# Patient Record
Sex: Female | Born: 1940 | Race: White | Hispanic: No | Smoking: Never smoker
Health system: Southern US, Community
[De-identification: ages and names within clinical notes are randomized; demographics above are authoritative.]

## PROBLEM LIST (undated history)

## (undated) DIAGNOSIS — E039 Hypothyroidism, unspecified: Secondary | ICD-10-CM

## (undated) DIAGNOSIS — I10 Essential (primary) hypertension: Secondary | ICD-10-CM

## (undated) DIAGNOSIS — K219 Gastro-esophageal reflux disease without esophagitis: Secondary | ICD-10-CM

## (undated) DIAGNOSIS — R011 Cardiac murmur, unspecified: Secondary | ICD-10-CM

## (undated) DIAGNOSIS — I350 Nonrheumatic aortic (valve) stenosis: Secondary | ICD-10-CM

## (undated) DIAGNOSIS — G4730 Sleep apnea, unspecified: Secondary | ICD-10-CM

## (undated) HISTORY — DX: Hypothyroidism, unspecified: E03.9

## (undated) HISTORY — DX: Nonrheumatic aortic (valve) stenosis: I35.0

## (undated) HISTORY — PX: CHOLECYSTECTOMY: SHX55

## (undated) HISTORY — DX: Essential (primary) hypertension: I10

## (undated) HISTORY — PX: ABDOMINAL HYSTERECTOMY: SHX81

---

## 2012-06-27 DIAGNOSIS — R32 Unspecified urinary incontinence: Secondary | ICD-10-CM | POA: Insufficient documentation

## 2012-08-01 DIAGNOSIS — H35369 Drusen (degenerative) of macula, unspecified eye: Secondary | ICD-10-CM | POA: Insufficient documentation

## 2012-08-01 DIAGNOSIS — H02889 Meibomian gland dysfunction of unspecified eye, unspecified eyelid: Secondary | ICD-10-CM | POA: Insufficient documentation

## 2012-08-01 DIAGNOSIS — H02839 Dermatochalasis of unspecified eye, unspecified eyelid: Secondary | ICD-10-CM | POA: Insufficient documentation

## 2012-08-01 DIAGNOSIS — H04129 Dry eye syndrome of unspecified lacrimal gland: Secondary | ICD-10-CM | POA: Insufficient documentation

## 2012-08-01 DIAGNOSIS — H52223 Regular astigmatism, bilateral: Secondary | ICD-10-CM | POA: Insufficient documentation

## 2013-05-19 DIAGNOSIS — K2270 Barrett's esophagus without dysplasia: Secondary | ICD-10-CM | POA: Insufficient documentation

## 2015-04-02 DIAGNOSIS — E039 Hypothyroidism, unspecified: Secondary | ICD-10-CM

## 2015-04-02 DIAGNOSIS — I10 Essential (primary) hypertension: Secondary | ICD-10-CM

## 2015-04-02 DIAGNOSIS — Z0000 Encounter for general adult medical examination without abnormal findings: Secondary | ICD-10-CM | POA: Insufficient documentation

## 2015-09-24 DIAGNOSIS — K219 Gastro-esophageal reflux disease without esophagitis: Secondary | ICD-10-CM | POA: Insufficient documentation

## 2016-01-09 DIAGNOSIS — M25569 Pain in unspecified knee: Secondary | ICD-10-CM | POA: Insufficient documentation

## 2016-08-25 DIAGNOSIS — H04123 Dry eye syndrome of bilateral lacrimal glands: Secondary | ICD-10-CM | POA: Insufficient documentation

## 2016-08-25 DIAGNOSIS — H02009 Unspecified entropion of unspecified eye, unspecified eyelid: Secondary | ICD-10-CM | POA: Insufficient documentation

## 2016-08-25 DIAGNOSIS — H35363 Drusen (degenerative) of macula, bilateral: Secondary | ICD-10-CM | POA: Insufficient documentation

## 2016-08-25 DIAGNOSIS — H40013 Open angle with borderline findings, low risk, bilateral: Secondary | ICD-10-CM | POA: Insufficient documentation

## 2016-08-25 DIAGNOSIS — H11121 Conjunctival concretions, right eye: Secondary | ICD-10-CM | POA: Insufficient documentation

## 2016-08-25 DIAGNOSIS — H57819 Brow ptosis, unspecified: Secondary | ICD-10-CM | POA: Insufficient documentation

## 2016-08-25 DIAGNOSIS — H43813 Vitreous degeneration, bilateral: Secondary | ICD-10-CM | POA: Insufficient documentation

## 2016-08-25 DIAGNOSIS — H02831 Dermatochalasis of right upper eyelid: Secondary | ICD-10-CM | POA: Insufficient documentation

## 2016-08-25 DIAGNOSIS — H02059 Trichiasis without entropian unspecified eye, unspecified eyelid: Secondary | ICD-10-CM | POA: Insufficient documentation

## 2016-08-25 DIAGNOSIS — Z83511 Family history of glaucoma: Secondary | ICD-10-CM | POA: Insufficient documentation

## 2017-06-04 DIAGNOSIS — R011 Cardiac murmur, unspecified: Secondary | ICD-10-CM | POA: Insufficient documentation

## 2018-08-02 DIAGNOSIS — M51369 Other intervertebral disc degeneration, lumbar region without mention of lumbar back pain or lower extremity pain: Secondary | ICD-10-CM | POA: Insufficient documentation

## 2018-08-02 DIAGNOSIS — M461 Sacroiliitis, not elsewhere classified: Secondary | ICD-10-CM | POA: Insufficient documentation

## 2018-08-24 DIAGNOSIS — K921 Melena: Secondary | ICD-10-CM | POA: Insufficient documentation

## 2018-08-24 DIAGNOSIS — D649 Anemia, unspecified: Secondary | ICD-10-CM | POA: Insufficient documentation

## 2018-10-14 DIAGNOSIS — M2550 Pain in unspecified joint: Secondary | ICD-10-CM | POA: Insufficient documentation

## 2018-12-15 DIAGNOSIS — D509 Iron deficiency anemia, unspecified: Secondary | ICD-10-CM | POA: Insufficient documentation

## 2019-10-05 DIAGNOSIS — H269 Unspecified cataract: Secondary | ICD-10-CM | POA: Insufficient documentation

## 2021-05-02 ENCOUNTER — Other Ambulatory Visit: Payer: Self-pay

## 2021-05-16 ENCOUNTER — Encounter: Payer: Self-pay | Admitting: *Deleted

## 2021-05-27 ENCOUNTER — Other Ambulatory Visit: Payer: Self-pay

## 2021-05-27 ENCOUNTER — Encounter: Payer: Self-pay | Admitting: Cardiovascular Disease

## 2021-05-27 ENCOUNTER — Ambulatory Visit: Payer: Medicare HMO | Admitting: Cardiovascular Disease

## 2021-05-27 VITALS — BP 122/82 | HR 84 | Ht 62.0 in | Wt 206.0 lb

## 2021-05-27 DIAGNOSIS — I350 Nonrheumatic aortic (valve) stenosis: Secondary | ICD-10-CM

## 2021-05-27 DIAGNOSIS — Z01812 Encounter for preprocedural laboratory examination: Secondary | ICD-10-CM

## 2021-05-27 DIAGNOSIS — Z01818 Encounter for other preprocedural examination: Secondary | ICD-10-CM

## 2021-05-27 NOTE — Assessment & Plan Note (Signed)
This Castrogiovanni was referred to me by Dr. Hanley Hays for right left heart cath because of severe aortic stenosis.  She has been complaining of increasing dyspnea on exertion  which is lifestyle limiting.  Her 2D echo revealed normal LV systolic function with severe aortic stenosis.  Aortic valve area was 0.6 cm with a peak gradient of 53 mmHg.  She wishes to proceed with right and left heart cath as an outpatient (radial/brachial) with possible TAVR referral afterwards.

## 2021-05-27 NOTE — Patient Instructions (Addendum)
Medication Instructions:  No Changes In Medications at this time.  *If you need a refill on your cardiac medications before your next appointment, please call your pharmacy*  Lab Work: BMET/CBC- PLEASE RETURN FOR THIS ON Monday July 11th If you have labs (blood work) drawn today and your tests are completely normal, you will receive your results only by: MyChart Message (if you have MyChart) OR A paper copy in the mail If you have any lab test that is abnormal or we need to change your treatment, we will call you to review the results.  Testing/Procedures: Your physician has requested that you have a cardiac catheterization. Cardiac catheterization is used to diagnose and/or treat various heart conditions. Doctors may recommend this procedure for a number of different reasons. The most common reason is to evaluate chest pain. Chest pain can be a symptom of coronary artery disease (CAD), and cardiac catheterization can show whether plaque is narrowing or blocking your heart's arteries. This procedure is also used to evaluate the valves, as well as measure the blood flow and oxygen levels in different parts of your heart. For further information please visit [email or web address] Please follow instruction sheet, as given.   Follow-Up: At [facility], you and your health needs are our priority.  As part of our continuing mission to provide you with exceptional heart care, we have created designated Provider Care Teams.  These Care Teams include your primary Cardiologist (physician) and Advanced Practice Providers (APPs -  Physician Assistants and Nurse Practitioners) who all work together to provide you with the care you need, when you need it.  We recommend signing up for the patient portal called "MyChart".  Sign up information is provided on this After Visit Summary.  MyChart is used to connect with patients for Virtual Visits (Telemedicine).  Patients are able to view lab/test results, encounter  notes, upcoming appointments, etc.  Non-urgent messages can be sent to your provider as well.   To learn more about what you can do with MyChart, go to [email or web address].    Your next appointment:   2 week(s) POST CATH   The format for your next appointment:   In Person  Provider:   Nanetta Batty, MD  Other Instructions    [facility] CARDIOVASCULAR DIVISION [facility] [address] [place] [address] Dept: [phone] Loc: [phone]  Nicole Gates  05/27/2021  You are scheduled for a Cardiac Catheterization on Monday, July 18 with Dr. Nanetta Batty.  1. Please arrive at the [facility] (Main Entrance A) at [facility]: [address] [place], [place] [address] at 9:30 AM (This time is two hours before your procedure to ensure your preparation). Free valet parking service is available.   Special note: Every effort is made to have your procedure done on time. Please understand that emergencies sometimes delay scheduled procedures.  2. Diet: Do not eat solid foods after midnight.  The patient may have clear liquids until 5am upon the day of the procedure.  3. Labs: You will need to have blood drawn on Monday, July 11 at [facility] [address], [place]  Open: 8am - 5pm (Lunch 12:30 - 1:30)   Phone: [phone]. You do not need to be fasting.  4. Medication instructions in preparation for your procedure:  Stop taking, Lisinopril (Zestril or Prinivil) Sunday, July 17,   Contrast Allergy: No  On the morning of your procedure, take your Aspirin and any morning medicines  NOT listed above.  You may use sips of water.  5. Plan for one night stay--bring personal belongings. 6. Bring a current list of your medications and current insurance cards. 7. You MUST have a responsible person to drive you home. 8. Someone MUST be with you the first 24 hours after you arrive home or your discharge will  be delayed. 9. Please wear clothes that are easy to get on and off and wear slip-on shoes.  Thank you for allowing [place] to care for you!   -- [name]

## 2021-05-27 NOTE — H&P (View-Only) (Signed)
05/27/2021 Nicole Gates   1941/01/28  [id number]  Primary Physician Sherrill Raring, MD Primary Cardiologist: Runell Gess MD [name], [place]  HPI:  Nicole Gates is a 80 y.o. moderately overweight widowed Caucasian female mother of 44, grandmother of 9 grandchildren who is accompanied by one of her daughters Herbert Seta today.  She was referred by Dr. Hanley Hays, her cardiologist, for right left heart cath because of symptomatic severe aortic stenosis.  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  She has no cardiac risk factors other than treated hypertension.  She never smoked.  Her brother did die of a myocardial infarction.  She is never had a heart attack or stroke.  She denies chest pain but has had progressive dyspnea.  She also has obstructive sleep apnea on CPAP.  She had a 2D echo performed by Dr. Hanley Hays revealing normal LV systolic function with severe aortic stenosis.  Valve area was 0.6 cm with a peak gradient of 57 mmHg.  A Myoview stress test was normal as well.  She has noticed increasing dyspnea on exertion when doing normal activity.   Current Meds  Medication Sig   Cholecalciferol (VITAMIN D3) 125 MCG (5000 UT) CAPS Take by mouth.   gabapentin (NEURONTIN) 300 MG capsule Take 300 mg by mouth 2 (two) times daily.   levothyroxine (SYNTHROID) 100 MCG tablet Take 100 mcg by mouth daily before breakfast.   lisinopril (ZESTRIL) 10 MG tablet Take 10 mg by mouth daily.   Magnesium 400 MG TABS Take by mouth.   mirabegron ER (MYRBETRIQ) 50 MG TB24 tablet Take 50 mg by mouth daily.   Potassium 99 MG TABS Take by mouth.   [DISCONTINUED] ferrous sulfate 325 (65 FE) MG EC tablet Take 325 mg by mouth 3 (three) times daily with meals.   [DISCONTINUED] pantoprazole (PROTONIX) 40 MG tablet Take 40 mg by mouth daily.     No Known Allergies  Social History   Socioeconomic History   Marital status: Widowed    Spouse name: Not  on file   Number of children: Not on file   Years of education: Not on file   Highest education level: Not on file  Occupational History   Not on file  Tobacco Use   Smoking status: Never   Smokeless tobacco: Never  Vaping Use   Vaping Use: Never used  Substance and Sexual Activity   Alcohol use: Never   Drug use: Not on file   Sexual activity: Not on file  Other Topics Concern   Not on file  Social History Narrative   Not on file   Social Determinants of Health   Financial Resource Strain: Not on file  Food Insecurity: Not on file  Transportation Needs: Not on file  Physical Activity: Not on file  Stress: Not on file  Social Connections: Not on file  Intimate Partner Violence: Not on file     Review of Systems: General: negative for chills, fever, night sweats or weight changes.  Cardiovascular: negative for chest pain, dyspnea on exertion, edema, orthopnea, palpitations, paroxysmal nocturnal dyspnea or shortness of breath Dermatological: negative for rash Respiratory: negative for cough or wheezing Urologic: negative for hematuria Abdominal: negative for nausea, vomiting, diarrhea, bright red blood per rectum, melena, or hematemesis Neurologic: negative for visual changes, syncope, or dizziness All other systems reviewed and are otherwise negative except as noted above.    Blood pressure  122/82, pulse 84, height 5' 2" (1.575 m), weight 206 lb (93.4 kg).  General appearance: alert and no distress Neck: no adenopathy, no JVD, supple, symmetrical, trachea midline, thyroid not enlarged, symmetric, no tenderness/mass/nodules, and bilateral carotid bruits versus transmitted murmur Lungs: clear to auscultation bilaterally Heart: 2/6 systolic ejection murmur at the base consistent with aortic stenosis Extremities: extremities normal, atraumatic, no cyanosis or edema Pulses: 2+ and symmetric Skin: Skin color, texture, turgor normal. No rashes or lesions Neurologic: Grossly  normal  EKG sinus rhythm at 84 with left bundle branch block.  I personally reviewed this EKG.  ASSESSMENT AND PLAN:   Severe aortic stenosis This Warriner was referred to me by Dr. Rosario for right left heart cath because of severe aortic stenosis.  She has been complaining of increasing dyspnea on exertion  which is lifestyle limiting.  Her 2D echo revealed normal LV systolic function with severe aortic stenosis.  Aortic valve area was 0.6 cm with a peak gradient of 53 mmHg.  She wishes to proceed with right and left heart cath as an outpatient (radial/brachial) with possible TAVR referral afterwards.     [name] J. Blessen Kimbrough MD FACP,FACC,FAHA, FSCAI 05/27/2021 11:40 AM 

## 2021-05-27 NOTE — Progress Notes (Signed)
05/27/2021 [name]   1941/01/28  [id number]  Primary Physician Sherrill Raring, MD Primary Cardiologist: Runell Gess MD [name], [place]  HPI:  Nayeliz Hipp is a 80 y.o. moderately overweight widowed Caucasian female mother of 44, grandmother of 9 grandchildren who is accompanied by one of her daughters Herbert Seta today.  She was referred by Dr. Hanley Hays, her cardiologist, for right left heart cath because of symptomatic severe aortic stenosis.  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  She has no cardiac risk factors other than treated hypertension.  She never smoked.  Her brother did die of a myocardial infarction.  She is never had a heart attack or stroke.  She denies chest pain but has had progressive dyspnea.  She also has obstructive sleep apnea on CPAP.  She had a 2D echo performed by Dr. Hanley Hays revealing normal LV systolic function with severe aortic stenosis.  Valve area was 0.6 cm with a peak gradient of 57 mmHg.  A Myoview stress test was normal as well.  She has noticed increasing dyspnea on exertion when doing normal activity.   Current Meds  Medication Sig   Cholecalciferol (VITAMIN D3) 125 MCG (5000 UT) CAPS Take by mouth.   gabapentin (NEURONTIN) 300 MG capsule Take 300 mg by mouth 2 (two) times daily.   levothyroxine (SYNTHROID) 100 MCG tablet Take 100 mcg by mouth daily before breakfast.   lisinopril (ZESTRIL) 10 MG tablet Take 10 mg by mouth daily.   Magnesium 400 MG TABS Take by mouth.   mirabegron ER (MYRBETRIQ) 50 MG TB24 tablet Take 50 mg by mouth daily.   Potassium 99 MG TABS Take by mouth.   [DISCONTINUED] ferrous sulfate 325 (65 FE) MG EC tablet Take 325 mg by mouth 3 (three) times daily with meals.   [DISCONTINUED] pantoprazole (PROTONIX) 40 MG tablet Take 40 mg by mouth daily.     No Known Allergies  Social History   Socioeconomic History   Marital status: Widowed    Spouse name: Not  on file   Number of children: Not on file   Years of education: Not on file   Highest education level: Not on file  Occupational History   Not on file  Tobacco Use   Smoking status: Never   Smokeless tobacco: Never  Vaping Use   Vaping Use: Never used  Substance and Sexual Activity   Alcohol use: Never   Drug use: Not on file   Sexual activity: Not on file  Other Topics Concern   Not on file  Social History Narrative   Not on file   Social Determinants of Health   Financial Resource Strain: Not on file  Food Insecurity: Not on file  Transportation Needs: Not on file  Physical Activity: Not on file  Stress: Not on file  Social Connections: Not on file  Intimate Partner Violence: Not on file     Review of Systems: General: negative for chills, fever, night sweats or weight changes.  Cardiovascular: negative for chest pain, dyspnea on exertion, edema, orthopnea, palpitations, paroxysmal nocturnal dyspnea or shortness of breath Dermatological: negative for rash Respiratory: negative for cough or wheezing Urologic: negative for hematuria Abdominal: negative for nausea, vomiting, diarrhea, bright red blood per rectum, melena, or hematemesis Neurologic: negative for visual changes, syncope, or dizziness All other systems reviewed and are otherwise negative except as noted above.    Blood pressure  122/82, pulse 84, height 5\' 2"  (1.575 m), weight 206 lb (93.4 kg).  General appearance: alert and no distress Neck: no adenopathy, no JVD, supple, symmetrical, trachea midline, thyroid not enlarged, symmetric, no tenderness/mass/nodules, and bilateral carotid bruits versus transmitted murmur Lungs: clear to auscultation bilaterally Heart: 2/6 systolic ejection murmur at the base consistent with aortic stenosis Extremities: extremities normal, atraumatic, no cyanosis or edema Pulses: 2+ and symmetric Skin: Skin color, texture, turgor normal. No rashes or lesions Neurologic: Grossly  normal  EKG sinus rhythm at 84 with left bundle branch block.  I personally reviewed this EKG.  ASSESSMENT AND PLAN:   Severe aortic stenosis This Huynh was referred to me by Dr. for right left heart cath because of severe aortic stenosis.  She has been complaining of increasing dyspnea on exertion  which is lifestyle limiting.  Her 2D echo revealed normal LV systolic function with severe aortic stenosis.  Aortic valve area was 0.6 cm with a peak gradient of 53 mmHg.  She wishes to proceed with right and left heart cath as an outpatient (radial/brachial) with possible TAVR referral afterwards.     Hanley Hays MD FACP,FACC,FAHA, [facility] 05/27/2021 11:40 AM

## 2021-06-02 LAB — BASIC METABOLIC PANEL
BUN/Creatinine Ratio: 21 (ref 12–28)
BUN: 17 mg/dL (ref 8–27)
CO2: 25 mmol/L (ref 20–29)
Calcium: 10.2 mg/dL (ref 8.7–10.3)
Chloride: 100 mmol/L (ref 96–106)
Creatinine, Ser: 0.8 mg/dL (ref 0.57–1.00)
Glucose: 100 mg/dL — ABNORMAL HIGH (ref 65–99)
Potassium: 5.2 mmol/L (ref 3.5–5.2)
Sodium: 140 mmol/L (ref 134–144)
eGFR: 75 mL/min/{1.73_m2} (ref >59)

## 2021-06-02 LAB — CBC
Hematocrit: 33.8 % — ABNORMAL LOW (ref 34.0–46.6)
Hemoglobin: 10.7 g/dL — ABNORMAL LOW (ref 11.1–15.9)
MCH: 26.6 pg (ref 26.6–33.0)
MCHC: 31.7 g/dL (ref 31.5–35.7)
MCV: 84 fL (ref 79–97)
Platelets: 321 10*3/uL (ref 150–450)
RBC: 4.03 x10E6/uL (ref 3.77–5.28)
RDW: 13.1 % (ref 11.7–15.4)
WBC: 6.4 10*3/uL (ref 3.4–10.8)

## 2021-06-05 ENCOUNTER — Telehealth: Payer: Self-pay | Admitting: *Deleted

## 2021-06-05 NOTE — Telephone Encounter (Signed)
Pt contacted pre-catheterization scheduled at [facility] for: Monday June 09, 2021 11:30 AM Verified arrival time and place: [facility] Main Entrance A [facility]) at: 9:30 AM   No solid food after midnight prior to cath, clear liquids until 5 AM day of procedure.   AM meds can be  taken pre-cath with sips of water including: aspirin 81 mg   Confirmed patient has responsible adult to drive home post procedure and be with patient first 24 hours after arriving home: yes  You are allowed ONE visitor in the waiting room during the time you are at the hospital for your procedure. Both you and your visitor must wear a mask once you enter the hospital.   Patient reports does not currently have any symptoms concerning for COVID-19 and no household members with COVID-19 like illness.      Reviewed procedure/mask/visitor instructions with patient.

## 2021-06-09 ENCOUNTER — Ambulatory Visit (HOSPITAL_COMMUNITY)
Admission: RE | Admit: 2021-06-09 | Discharge: 2021-06-09 | Disposition: A | Discharge status: Home / Self Care | Payer: Medicare HMO | Attending: Cardiovascular Disease | Admitting: Cardiovascular Disease

## 2021-06-09 ENCOUNTER — Other Ambulatory Visit: Payer: Self-pay

## 2021-06-09 ENCOUNTER — Encounter (HOSPITAL_COMMUNITY)
Admission: RE | Disposition: A | Discharge status: Home / Self Care | Payer: Self-pay | Source: Home / Self Care | Attending: Cardiovascular Disease

## 2021-06-09 DIAGNOSIS — Z006 Encounter for examination for normal comparison and control in clinical research program: Secondary | ICD-10-CM

## 2021-06-09 DIAGNOSIS — Z8249 Family history of ischemic heart disease and other diseases of the circulatory system: Secondary | ICD-10-CM | POA: Insufficient documentation

## 2021-06-09 DIAGNOSIS — I350 Nonrheumatic aortic (valve) stenosis: Secondary | ICD-10-CM | POA: Insufficient documentation

## 2021-06-09 DIAGNOSIS — Z79899 Other long term (current) drug therapy: Secondary | ICD-10-CM | POA: Diagnosis not present

## 2021-06-09 DIAGNOSIS — Z6836 Body mass index (BMI) 36.0-36.9, adult: Secondary | ICD-10-CM | POA: Diagnosis not present

## 2021-06-09 DIAGNOSIS — Z79890 Hormone replacement therapy (postmenopausal): Secondary | ICD-10-CM | POA: Insufficient documentation

## 2021-06-09 DIAGNOSIS — I10 Essential (primary) hypertension: Secondary | ICD-10-CM | POA: Insufficient documentation

## 2021-06-09 DIAGNOSIS — G4733 Obstructive sleep apnea (adult) (pediatric): Secondary | ICD-10-CM | POA: Diagnosis not present

## 2021-06-09 DIAGNOSIS — E663 Overweight: Secondary | ICD-10-CM | POA: Diagnosis not present

## 2021-06-09 HISTORY — PX: RIGHT/LEFT HEART CATH AND CORONARY ANGIOGRAPHY: CATH118266

## 2021-06-09 LAB — POCT I-STAT EG7
Acid-Base Excess: 3 mmol/L — ABNORMAL HIGH (ref 0.0–2.0)
Acid-Base Excess: 3 mmol/L — ABNORMAL HIGH (ref 0.0–2.0)
Bicarbonate: 29.2 mmol/L — ABNORMAL HIGH (ref 20.0–28.0)
Bicarbonate: 29.2 mmol/L — ABNORMAL HIGH (ref 20.0–28.0)
Calcium, Ion: 1.33 mmol/L (ref 1.15–1.40)
Calcium, Ion: 1.34 mmol/L (ref 1.15–1.40)
HCT: 31 % — ABNORMAL LOW (ref 36.0–46.0)
HCT: 31 % — ABNORMAL LOW (ref 36.0–46.0)
Hemoglobin: 10.5 g/dL — ABNORMAL LOW (ref 12.0–15.0)
Hemoglobin: 10.5 g/dL — ABNORMAL LOW (ref 12.0–15.0)
O2 Saturation: 78 %
O2 Saturation: 79 %
Potassium: 4.1 mmol/L (ref 3.5–5.1)
Potassium: 4.1 mmol/L (ref 3.5–5.1)
Sodium: 142 mmol/L (ref 135–145)
Sodium: 142 mmol/L (ref 135–145)
TCO2: 31 mmol/L (ref 22–32)
TCO2: 31 mmol/L (ref 22–32)
pCO2, Ven: 49 mmHg (ref 44.0–60.0)
pCO2, Ven: 49.3 mmHg (ref 44.0–60.0)
pH, Ven: 7.38 (ref 7.250–7.430)
pH, Ven: 7.383 (ref 7.250–7.430)
pO2, Ven: 44 mmHg (ref 32.0–45.0)
pO2, Ven: 45 mmHg (ref 32.0–45.0)

## 2021-06-09 LAB — POCT I-STAT 7, (LYTES, BLD GAS, ICA,H+H)
Acid-Base Excess: 4 mmol/L — ABNORMAL HIGH (ref 0.0–2.0)
Bicarbonate: 29 mmol/L — ABNORMAL HIGH (ref 20.0–28.0)
Calcium, Ion: 1.27 mmol/L (ref 1.15–1.40)
HCT: 30 % — ABNORMAL LOW (ref 36.0–46.0)
Hemoglobin: 10.2 g/dL — ABNORMAL LOW (ref 12.0–15.0)
O2 Saturation: 99 %
Potassium: 4.1 mmol/L (ref 3.5–5.1)
Sodium: 142 mmol/L (ref 135–145)
TCO2: 30 mmol/L (ref 22–32)
pCO2 arterial: 44.3 mmHg (ref 32.0–48.0)
pH, Arterial: 7.424 (ref 7.350–7.450)
pO2, Arterial: 160 mmHg — ABNORMAL HIGH (ref 83.0–108.0)

## 2021-06-09 SURGERY — RIGHT/LEFT HEART CATH AND CORONARY ANGIOGRAPHY
Anesthesia: LOCAL

## 2021-06-09 MED ORDER — NITROGLYCERIN 1 MG/10 ML FOR IR/CATH LAB
INTRA_ARTERIAL | Status: AC
  Filled 2021-06-09: qty 10

## 2021-06-09 MED ORDER — SODIUM CHLORIDE 0.9 % WEIGHT BASED INFUSION: 1.0000 mL/kg/h | INTRAVENOUS | Status: DC

## 2021-06-09 MED ORDER — SODIUM CHLORIDE 0.9 % IV SOLN: INTRAVENOUS | Status: DC

## 2021-06-09 MED ORDER — ASPIRIN 81 MG PO CHEW
81.0000 mg | CHEWABLE_TABLET | Freq: Every day | ORAL | Status: DC
  Administered 2021-06-09: 81 mg via ORAL
  Filled 2021-06-09: qty 1

## 2021-06-09 MED ORDER — IOHEXOL 350 MG/ML SOLN
INTRAVENOUS | Status: DC | PRN
  Administered 2021-06-09: 35 mL

## 2021-06-09 MED ORDER — SODIUM CHLORIDE 0.9% FLUSH: 3.0000 mL | INTRAVENOUS | Status: DC | PRN

## 2021-06-09 MED ORDER — HYDRALAZINE HCL 20 MG/ML IJ SOLN: 10.0000 mg | INTRAMUSCULAR | Status: DC | PRN

## 2021-06-09 MED ORDER — ASPIRIN 81 MG PO CHEW: 81.0000 mg | CHEWABLE_TABLET | ORAL | Status: DC

## 2021-06-09 MED ORDER — ACETAMINOPHEN 325 MG PO TABS: 650.0000 mg | ORAL_TABLET | ORAL | Status: DC | PRN

## 2021-06-09 MED ORDER — HEPARIN (PORCINE) IN NACL 1000-0.9 UT/500ML-% IV SOLN
INTRAVENOUS | Status: DC | PRN
  Administered 2021-06-09 (×2): 500 mL

## 2021-06-09 MED ORDER — VERAPAMIL HCL 2.5 MG/ML IV SOLN
INTRA_ARTERIAL | Status: DC | PRN
  Administered 2021-06-09: 6 mL via INTRA_ARTERIAL

## 2021-06-09 MED ORDER — HEPARIN SODIUM (PORCINE) 1000 UNIT/ML IJ SOLN
INTRAMUSCULAR | Status: DC | PRN
  Administered 2021-06-09: 4500 [IU] via INTRAVENOUS

## 2021-06-09 MED ORDER — ONDANSETRON HCL 4 MG/2ML IJ SOLN: 4.0000 mg | Freq: Four times a day (QID) | INTRAMUSCULAR | Status: DC | PRN

## 2021-06-09 MED ORDER — SODIUM CHLORIDE 0.9 % IV SOLN: 250.0000 mL | INTRAVENOUS | Status: DC | PRN

## 2021-06-09 MED ORDER — SODIUM CHLORIDE 0.9 % WEIGHT BASED INFUSION
3.0000 mL/kg/h | INTRAVENOUS | Status: AC
  Administered 2021-06-09: 3 mL/kg/h via INTRAVENOUS

## 2021-06-09 MED ORDER — HEPARIN SODIUM (PORCINE) 1000 UNIT/ML IJ SOLN
INTRAMUSCULAR | Status: AC
  Filled 2021-06-09: qty 1

## 2021-06-09 MED ORDER — SODIUM CHLORIDE 0.9% FLUSH: 3.0000 mL | Freq: Two times a day (BID) | INTRAVENOUS | Status: DC

## 2021-06-09 MED ORDER — LIDOCAINE HCL (PF) 1 % IJ SOLN
INTRAMUSCULAR | Status: AC
  Filled 2021-06-09: qty 30

## 2021-06-09 MED ORDER — MORPHINE SULFATE (PF) 2 MG/ML IV SOLN: 2.0000 mg | INTRAVENOUS | Status: DC | PRN

## 2021-06-09 MED ORDER — HEPARIN (PORCINE) IN NACL 1000-0.9 UT/500ML-% IV SOLN
INTRAVENOUS | Status: AC
  Filled 2021-06-09: qty 1000

## 2021-06-09 MED ORDER — VERAPAMIL HCL 2.5 MG/ML IV SOLN
INTRAVENOUS | Status: AC
  Filled 2021-06-09: qty 2

## 2021-06-09 MED ORDER — LIDOCAINE HCL (PF) 1 % IJ SOLN
INTRAMUSCULAR | Status: DC | PRN
  Administered 2021-06-09: 10 mL
  Administered 2021-06-09: 2 mL via SUBCUTANEOUS

## 2021-06-09 MED ORDER — LABETALOL HCL 5 MG/ML IV SOLN: 10.0000 mg | INTRAVENOUS | Status: DC | PRN

## 2021-06-09 SURGICAL SUPPLY — 16 items
CATH INFINITI JR4 5F (CATHETERS) ×2 IMPLANT
CATH OPTITORQUE TIG 4.0 5F (CATHETERS) ×2 IMPLANT
CATH SWAN GANZ 7F STRAIGHT (CATHETERS) ×2 IMPLANT
CLOSURE MYNX CONTROL 6F/7F (Vascular Products) ×2 IMPLANT
DEVICE RAD COMP TR BAND LRG (VASCULAR PRODUCTS) ×2 IMPLANT
GLIDESHEATH SLEND A-KIT 6F 22G (SHEATH) ×2 IMPLANT
GUIDEWIRE INQWIRE 1.5J.035X260 (WIRE) ×1 IMPLANT
INQWIRE 1.5J .035X260CM (WIRE) ×2
KIT HEART LEFT (KITS) ×2 IMPLANT
PACK CARDIAC CATHETERIZATION (CUSTOM PROCEDURE TRAY) ×2 IMPLANT
SHEATH GLIDE SLENDER 4/5FR (SHEATH) IMPLANT
SHEATH PINNACLE 7F 10CM (SHEATH) ×2 IMPLANT
TRANSDUCER W/STOPCOCK (MISCELLANEOUS) ×2 IMPLANT
TUBING CIL FLEX 10 FLL-RA (TUBING) ×2 IMPLANT
WIRE EMERALD ST .035X260CM (WIRE) ×2 IMPLANT
WIRE HI TORQ VERSACORE-J 145CM (WIRE) ×2 IMPLANT

## 2021-06-09 NOTE — Research (Signed)
IDENTIFY Informed Consent                  Subject Name: Nicole Gates    Subject met inclusion and exclusion criteria.  The informed consent form, study requirements and expectations were reviewed with the subject and questions and concerns were addressed prior to the signing of the consent form.  The subject verbalized understanding of the trial requirements.  The subject agreed to participate in the IDENTIFY trial and signed the informed consent at 10:13AM on 06/09/21.  The informed consent was obtained prior to performance of any protocol-specific procedures for the subject.  A copy of the signed informed consent was given to the subject and a copy was placed in the subject's medical record.    Ledon Snare , Research Assistant

## 2021-06-09 NOTE — Interval H&P Note (Signed)
Cath Lab Visit (complete for each Cath Lab visit)  Clinical Evaluation Leading to the Procedure:   ACS: No.  Non-ACS:    Anginal Classification: No Symptoms  Anti-ischemic medical therapy: No Therapy  Non-Invasive Test Results: Low-risk stress test findings: cardiac mortality <1%/year  Prior CABG: No previous CABG      History and Physical Interval Note:  06/09/2021 12:52 PM  Nicole Gates  has presented today for surgery, with the diagnosis of aortic stenosis.  The various methods of treatment have been discussed with the patient and family. After consideration of risks, benefits and other options for treatment, the patient has consented to  Procedure(s): RIGHT/LEFT HEART CATH AND CORONARY ANGIOGRAPHY (N/A) as a surgical intervention.  The patient's history has been reviewed, patient examined, no change in status, stable for surgery.  I have reviewed the patient's chart and labs.  Questions were answered to the patient's satisfaction.     Nanetta Batty

## 2021-06-10 ENCOUNTER — Encounter (HOSPITAL_COMMUNITY): Payer: Self-pay | Admitting: Cardiovascular Disease

## 2021-07-01 ENCOUNTER — Other Ambulatory Visit: Payer: Self-pay

## 2021-07-01 ENCOUNTER — Ambulatory Visit: Payer: Medicare HMO | Admitting: Cardiovascular Disease

## 2021-07-01 ENCOUNTER — Encounter: Payer: Self-pay | Admitting: Cardiovascular Disease

## 2021-07-01 VITALS — BP 134/74 | HR 84 | Ht 62.0 in | Wt 211.0 lb

## 2021-07-01 DIAGNOSIS — I350 Nonrheumatic aortic (valve) stenosis: Secondary | ICD-10-CM | POA: Diagnosis not present

## 2021-07-01 NOTE — Patient Instructions (Signed)
  Follow-Up: At [facility], you and your health needs are our priority.  As part of our continuing mission to provide you with exceptional heart care, we have created designated Provider Care Teams.  These Care Teams include your primary Cardiologist (physician) and Advanced Practice Providers (APPs -  Physician Assistants and Nurse Practitioners) who all work together to provide you with the care you need, when you need it.  We recommend signing up for the patient portal called "MyChart".  Sign up information is provided on this After Visit Summary.  MyChart is used to connect with patients for Virtual Visits (Telemedicine).  Patients are able to view lab/test results, encounter notes, upcoming appointments, etc.  Non-urgent messages can be sent to your provider as well.   To learn more about what you can do with MyChart, go to [email or web address].    Your next appointment:   3 month(s)  The format for your next appointment:   In Person  Provider:   Jonathan Berry, MD    

## 2021-07-01 NOTE — Progress Notes (Signed)
Ms. Hibbitts returns for follow-up after her right and left heart cath which I performed 06/09/2021 in the evaluation of aortic stenosis.  She was referred by Dr. Hanley Hays.  She had clean coronary arteries and a indexed valve area of 0.66 cm/m.  She is fairly functionally limited.  I am referring her to the structural valve clinic for evaluation for suitability of TAVR.  Runell Gess, M.D., FACP, [facility], [facility] [address] [place], [place]  [address]  [phone] 07/01/2021 11:13 AM

## 2021-07-02 ENCOUNTER — Other Ambulatory Visit: Payer: Self-pay

## 2021-07-02 DIAGNOSIS — I350 Nonrheumatic aortic (valve) stenosis: Secondary | ICD-10-CM

## 2021-07-25 ENCOUNTER — Ambulatory Visit: Payer: Medicare HMO | Admitting: Cardiovascular Disease

## 2021-07-25 ENCOUNTER — Other Ambulatory Visit: Payer: Self-pay

## 2021-07-25 ENCOUNTER — Encounter: Payer: Self-pay | Admitting: Cardiovascular Disease

## 2021-07-25 ENCOUNTER — Ambulatory Visit (HOSPITAL_COMMUNITY): Payer: Medicare HMO | Attending: Cardiology

## 2021-07-25 VITALS — BP 138/76 | HR 82 | Ht 62.0 in | Wt 209.6 lb

## 2021-07-25 DIAGNOSIS — I350 Nonrheumatic aortic (valve) stenosis: Secondary | ICD-10-CM

## 2021-07-25 DIAGNOSIS — K089 Disorder of teeth and supporting structures, unspecified: Secondary | ICD-10-CM

## 2021-07-25 LAB — ECHOCARDIOGRAM COMPLETE
AR max vel: 0.92 cm2
AV Area VTI: 0.87 cm2
AV Area mean vel: 0.89 cm2
AV Mean grad: 27.7 mmHg
AV Peak grad: 46.5 mmHg
Ao pk vel: 3.41 m/s
Area-P 1/2: 4.57 cm2
S' Lateral: 3.2 cm

## 2021-07-25 NOTE — Patient Instructions (Signed)
Medication Instructions:  No changes *If you need a refill on your cardiac medications before your next appointment, please call your pharmacy*   Lab Work: BMET If you have labs (blood work) drawn today and your tests are completely normal, you will receive your results only by: MyChart Message (if you have MyChart) OR A paper copy in the mail If you have any lab test that is abnormal or we need to change your treatment, we will call you to review the results.   Testing/Procedures: none  Follow-Up: Per Structural Heart Team  Other Instructions

## 2021-07-25 NOTE — Progress Notes (Addendum)
[facility] Consult Note  Chief Complaint  Patient presents with   New Patient (Initial Visit)    Severe aortic stenosis    History of Present Illness:80 yo female with history of HTN, hypothyroidism, peripheral neuropathy and severe aortic stenosis who is here today as a new consult, referred by Dr. Allyson Sabal, for further discussion regarding her aortic stenosis and possible TAVR. She was seen recently by Dr. Allyson Sabal to arrange a right and left heart cath to better evaluate her aortic stenosis. She has been followed prior to that by Dr. Hanley Hays. She has had recent progressive dyspnea on exertion. Cardiac cath 06/09/21 with no evidence of CAD. AV mean gradient 27.32mmHg. Echo from May 2022 in [facility] with LVEF over 60%, mean AV gradient 30 mmHg, AVA 0.6 cm2. Echo today in our office with LVEF=45-50%, Mild MR. The aortic valve appears stenotic. The leaflets are thickened and calcified. Mean gradient 27 mmHg. AVA 0.87 cm2. SVI 34. Dimensionless index 0.28. I think this represents early severe aortic stenosis.   She tells me today that she has progressive dyspnea on exertion, chest pressure and dizziness. She has some LE edema. She lives in [place], [place] in a senior apartment alone. She is a retired Futures trader. She has partial dentures on top and bottom and reports that some of her teeth are broken.    Primary Care Physician: Sherrill Raring, MD Primary Cardiologist: Allyson Sabal Referring Cardiologist: Allyson Sabal  Past Medical History:  Diagnosis Date   Hypertension    Hypothyroid    Severe aortic stenosis     Past Surgical History:  Procedure Laterality Date   ABDOMINAL HYSTERECTOMY     CHOLECYSTECTOMY     RIGHT/LEFT HEART CATH AND CORONARY ANGIOGRAPHY N/A 06/09/2021   Procedure: RIGHT/LEFT HEART CATH AND CORONARY ANGIOGRAPHY;  Surgeon: Runell Gess, MD;  Location: [facility];  Service: Cardiovascular;  Laterality: N/A;    Current Outpatient Medications   Medication Sig Dispense Refill   Ascorbic Acid (VITAMIN C PO) Take 1 capsule by mouth daily.     calcium carbonate (OS-CAL) 1250 (500 Ca) MG chewable tablet Chew 1 tablet by mouth daily.     Cholecalciferol (VITAMIN D3) 125 MCG (5000 UT) CAPS Take 5,000 Units by mouth daily.     estradiol (ESTRACE) 0.1 MG/GM vaginal cream Place 1 Applicatorful vaginally at bedtime.     fluticasone (FLONASE) 50 MCG/ACT nasal spray Place 1 spray into both nostrils daily.     gabapentin (NEURONTIN) 300 MG capsule Take 600 mg by mouth at bedtime.     levothyroxine (SYNTHROID) 100 MCG tablet Take 100 mcg by mouth daily before breakfast.     lisinopril (ZESTRIL) 10 MG tablet Take 10 mg by mouth daily.     loratadine (CLARITIN) 10 MG tablet Take 10 mg by mouth daily.     Magnesium 400 MG TABS Take 400 mg by mouth daily.     mirabegron ER (MYRBETRIQ) 50 MG TB24 tablet Take 50 mg by mouth daily.     Multiple Vitamins-Minerals (ZINC PO) Take 1 capsule by mouth daily.     niacin 500 MG tablet Take 500 mg by mouth daily.     nystatin cream (MYCOSTATIN) Apply 1 application topically 2 (two) times daily.     pantoprazole (PROTONIX) 40 MG tablet Take 40 mg by mouth daily.     polyvinyl alcohol (LIQUIFILM TEARS) 1.4 % ophthalmic solution Place 1 drop into both eyes daily as needed for dry eyes.  POTASSIUM PO Take 595 mg by mouth daily.     No current facility-administered medications for this visit.    No Known Allergies  Social History   Socioeconomic History   Marital status: Widowed    Spouse name: Not on file   Number of children: 6   Years of education: Not on file   Highest education level: Not on file  Occupational History   Occupation: Futures traderHomemaker, drove a school bus, Engineer, petroleumcafeteria worker  Tobacco Use   Smoking status: Never   Smokeless tobacco: Never  Vaping Use   Vaping Use: Never used  Substance and Sexual Activity   Alcohol use: Never   Drug use: Never   Sexual activity: Not on file  Other  Topics Concern   Not on file  Social History Narrative   Not on file   Social Determinants of Health   Financial Resource Strain: Not on file  Food Insecurity: Not on file  Transportation Needs: Not on file  Physical Activity: Not on file  Stress: Not on file  Social Connections: Not on file  Intimate Partner Violence: Not on file    Family History  Problem Relation Age of Onset   Cancer Father    Diabetes Brother     Review of Systems:  As stated in the HPI and otherwise negative.   BP 138/76   Pulse 82   Ht 5\' 2"  (1.575 m)   Wt 209 lb 9.6 oz (95.1 kg)   SpO2 97%   BMI 38.34 kg/m   Physical Examination: General: Well developed, well nourished, NAD  HEENT: OP clear, mucus membranes moist  SKIN: warm, dry. No rashes. Neuro: No focal deficits  Musculoskeletal: Muscle strength 5/5 all ext  Psychiatric: Mood and affect normal  Neck: No JVD, no carotid bruits, no thyromegaly, no lymphadenopathy.  Lungs:Clear bilaterally, no wheezes, rhonci, crackles Cardiovascular: Regular rate and rhythm. Loud, harsh, late peaking systolic murmur.  Abdomen:Soft. Bowel sounds present. Non-tender.  Extremities: No lower extremity edema. Pulses are 2 + in the bilateral DP/PT.  Echo 07/25/21:  1. Left ventricular ejection fraction, by estimation, is 45 to 50%. The left ventricle has mildly decreased function. The left ventricle demonstrates global hypokinesis. Left ventricular diastolic parameters are consistent with Grade I diastolic  dysfunction (impaired relaxation). The average left ventricular global longitudinal strain is -14.3 %. The global longitudinal strain is abnormal.  2. Right ventricular systolic function is normal. The right ventricular size is normal. There is mildly elevated pulmonary artery systolic pressure. The estimated right ventricular systolic pressure is 42.9 mmHg.  3. Left atrial size was mildly dilated.  4. The mitral valve is degenerative. Mild mitral valve  regurgitation. No evidence of mitral stenosis.  5. Tricuspid valve regurgitation is moderate.  6. The aortic valve is calcified. There is moderate calcification of the aortic valve. There is moderate thickening of the aortic valve. Aortic valve regurgitation is not visualized. Moderate aortic valve stenosis. Aortic valve area, by VTI measures  0.87 cm. Aortic valve mean gradient measures 27.7 mmHg. Aortic valve Vmax measures 3.41 m/s.  7. The inferior vena cava is normal in size with greater than 50% respiratory variability, suggesting right atrial pressure of 3 mmHg.   FINDINGS  Left Ventricle: Left ventricular ejection fraction, by estimation, is 45 to 50%. The left ventricle has mildly decreased function. The left ventricle demonstrates global hypokinesis. The average left ventricular global longitudinal strain is -14.3 %.  The global longitudinal strain is abnormal. The left ventricular internal  cavity size was normal in size. There is no left ventricular hypertrophy. Left ventricular diastolic parameters are consistent with Grade I diastolic dysfunction (impaired  relaxation).   Right Ventricle: The right ventricular size is normal. No increase in right ventricular wall thickness. Right ventricular systolic function is normal. There is mildly elevated pulmonary artery systolic pressure. The tricuspid regurgitant velocity is 3.16  m/s, and with an assumed right atrial pressure of 3 mmHg, the estimated right ventricular systolic pressure is 42.9 mmHg.   Left Atrium: Left atrial size was mildly dilated.   Right Atrium: Right atrial size was normal in size.   Pericardium: There is no evidence of pericardial effusion.   Mitral Valve: The mitral valve is degenerative in appearance. There is moderate thickening of the mitral valve leaflet(s). There is moderate calcification of the mitral valve leaflet(s). Mild mitral valve regurgitation. No evidence of mitral valve  stenosis.   Tricuspid Valve:  The tricuspid valve is normal in structure. Tricuspid valve regurgitation is moderate . No evidence of tricuspid stenosis.   Aortic Valve: The aortic valve is calcified. There is moderate calcification of the aortic valve. There is moderate thickening of the aortic valve. Aortic valve regurgitation is not visualized. Moderate aortic stenosis is present. Aortic valve mean  gradient measures 27.7 mmHg. Aortic valve peak gradient measures 46.5 mmHg. Aortic valve area, by VTI measures 0.87 cm.   Pulmonic Valve: The pulmonic valve was normal in structure. Pulmonic valve regurgitation is mild. No evidence of pulmonic stenosis.   Aorta: The aortic root is normal in size and structure.   Venous: The inferior vena cava is normal in size with greater than 50% respiratory variability, suggesting right atrial pressure of 3 mmHg.   IAS/Shunts: No atrial level shunt detected by color flow Doppler.     LEFT VENTRICLE PLAX 2D LVIDd:         4.60 cm  Diastology LVIDs:         3.20 cm  LV e' medial:    5.55 cm/s LV PW:         1.00 cm  LV E/e' medial:  23.4 LV IVS:        1.00 cm  LV e' lateral:   9.95 cm/s LVOT diam:     2.00 cm  LV E/e' lateral: 13.1 LV SV:         67 LV SV Index:   34       2D Longitudinal Strain LVOT Area:     3.14 cm 2D Strain GLS Avg:     -14.3 %     RIGHT VENTRICLE             IVC RV S prime:     23.00 cm/s  IVC diam: 1.70 cm TAPSE (M-mode): 2.6 cm RVSP:           42.9 mmHg   LEFT ATRIUM             Index       RIGHT ATRIUM           Index LA diam:        5.00 cm 2.56 cm/m  RA Pressure: 3.00 mmHg LA Vol (A2C):   66.0 ml 33.75 ml/m RA Area:     14.00 cm LA Vol (A4C):   72.1 ml 36.87 ml/m RA Volume:   33.30 ml  17.03 ml/m LA Biplane Vol: 72.9 ml 37.28 ml/m  AORTIC VALVE AV Area (Vmax):    0.92 cm AV Area (  Vmean):   0.89 cm AV Area (VTI):     0.87 cm AV Vmax:           341.00 cm/s AV Vmean:          246.000 cm/s AV VTI:            0.770 m AV Peak Grad:       46.5 mmHg AV Mean Grad:      27.7 mmHg LVOT Vmax:         100.00 cm/s LVOT Vmean:        69.400 cm/s LVOT VTI:          0.214 m LVOT/AV VTI ratio: 0.28   AORTA Ao Root diam: 3.00 cm Ao Asc diam:  3.55 cm   MITRAL VALVE                TRICUSPID VALVE MV Area (PHT): 4.57 cm     TR Peak grad:   39.9 mmHg MV Decel Time: 166 msec     TR Vmax:        316.00 cm/s MV E velocity: 130.00 cm/s  Estimated RAP:  3.00 mmHg MV A velocity: 156.00 cm/s  RVSP:           42.9 mmHg MV E/A ratio:  0.83                             SHUNTS                             Systemic VTI:  0.21 m                             Systemic Diam: 2.00 cm  Recent Labs: 06/02/2021: BUN 17; Creatinine, Ser 0.80; Platelets 321 06/09/2021: Hemoglobin 10.2; Potassium 4.1; Sodium 142    Wt Readings from Last 3 Encounters:  07/25/21 209 lb 9.6 oz (95.1 kg)  07/01/21 211 lb (95.7 kg)  06/09/21 200 lb (90.7 kg)     Other studies Reviewed: Additional studies/ records that were reviewed today include: echo images, office notes. Review of the above records demonstrates: severe AS   Assessment and Plan:   1. Severe Aortic Valve Stenosis: She likely has severe, stage D aortic valve stenosis. The SVI is low with borderline mean gradient, AVA and dimensionless index. The valve appears severely stenotic. I have personally reviewed the echo images. The aortic valve is thickened, calcified with limited leaflet mobility. Given her symptoms, I think she would benefit from AVR. Given advanced age, she is not a good candidate for conventional AVR by surgical approach. I think she may be a good candidate for TAVR.   STS Risk Score: Risk of Mortality: 1.859% Renal Failure: 1.599% Permanent Stroke: 1.052% Prolonged Ventilation: 5.725% DSW Infection: 0.119% Reoperation: 2.401% Morbidity or Mortality: 9.606% Short Length of Stay: 36.061% Long Length of Stay: 4.543%  I have reviewed the natural history of aortic stenosis with  the patient and their family members  who are present today. We have discussed the limitations of medical therapy and the poor prognosis associated with symptomatic aortic stenosis. We have reviewed potential treatment options, including palliative medical therapy, conventional surgical aortic valve replacement, and transcatheter aortic valve replacement. We discussed treatment options in the context of the patient's specific comorbid medical conditions.   She would like to proceed with  planning for TAVR. Risks and benefits of the valve procedure are reviewed with the patient. We will arrange a cardiac CT, CTA of the chest/abdomen and pelvis, carotid artery dopplers, PT assessment and will then be referred to see Dr. Laneta Simmers.    BMET today. She will need a dental consult.      Current medicines are reviewed at length with the patient today.  The patient does not have concerns regarding medicines.  The following changes have been made:  no change  Labs/ tests ordered today include:   Orders Placed This Encounter  Procedures   Basic Metabolic Panel (BMET)      Disposition:   F/U with the valve team.    Signed, Verne Carrow, MD 07/25/2021 2:31 PM    [facility] [address] [place], [place], [place]  [address] Phone: [phone]; Fax: [phone]

## 2021-07-26 LAB — BASIC METABOLIC PANEL
BUN/Creatinine Ratio: 24 (ref 12–28)
BUN: 21 mg/dL (ref 8–27)
CO2: 26 mmol/L (ref 20–29)
Calcium: 10.8 mg/dL — ABNORMAL HIGH (ref 8.7–10.3)
Chloride: 100 mmol/L (ref 96–106)
Creatinine, Ser: 0.87 mg/dL (ref 0.57–1.00)
Glucose: 87 mg/dL (ref 65–99)
Potassium: 4.6 mmol/L (ref 3.5–5.2)
Sodium: 142 mmol/L (ref 134–144)
eGFR: 68 mL/min/{1.73_m2} (ref >59)

## 2021-07-29 ENCOUNTER — Other Ambulatory Visit: Payer: Self-pay

## 2021-07-29 DIAGNOSIS — I350 Nonrheumatic aortic (valve) stenosis: Secondary | ICD-10-CM

## 2021-07-29 MED ORDER — METOPROLOL TARTRATE 25 MG PO TABS: 25.0000 mg | ORAL_TABLET | Freq: Once | ORAL | 0 refills | Status: DC

## 2021-08-04 ENCOUNTER — Encounter (HOSPITAL_COMMUNITY): Payer: Self-pay | Admitting: Dentistry

## 2021-08-04 ENCOUNTER — Ambulatory Visit (INDEPENDENT_AMBULATORY_CARE_PROVIDER_SITE_OTHER): Payer: Medicare HMO | Admitting: Dentistry

## 2021-08-04 ENCOUNTER — Other Ambulatory Visit: Payer: Self-pay

## 2021-08-04 DIAGNOSIS — K036 Deposits [accretions] on teeth: Secondary | ICD-10-CM

## 2021-08-04 DIAGNOSIS — Z01818 Encounter for other preprocedural examination: Secondary | ICD-10-CM | POA: Diagnosis not present

## 2021-08-04 DIAGNOSIS — K0510 Chronic gingivitis, plaque induced: Secondary | ICD-10-CM

## 2021-08-04 DIAGNOSIS — I350 Nonrheumatic aortic (valve) stenosis: Secondary | ICD-10-CM

## 2021-08-04 DIAGNOSIS — K08109 Complete loss of teeth, unspecified cause, unspecified class: Secondary | ICD-10-CM

## 2021-08-04 DIAGNOSIS — K083 Retained dental root: Secondary | ICD-10-CM

## 2021-08-04 DIAGNOSIS — K031 Abrasion of teeth: Secondary | ICD-10-CM

## 2021-08-04 DIAGNOSIS — K029 Dental caries, unspecified: Secondary | ICD-10-CM

## 2021-08-04 DIAGNOSIS — K06010 Localized gingival recession, unspecified: Secondary | ICD-10-CM

## 2021-08-04 DIAGNOSIS — K030 Excessive attrition of teeth: Secondary | ICD-10-CM

## 2021-08-04 DIAGNOSIS — K0850 Unsatisfactory restoration of tooth, unspecified: Secondary | ICD-10-CM

## 2021-08-04 DIAGNOSIS — Z972 Presence of dental prosthetic device (complete) (partial): Secondary | ICD-10-CM | POA: Diagnosis not present

## 2021-08-04 DIAGNOSIS — K0889 Other specified disorders of teeth and supporting structures: Secondary | ICD-10-CM

## 2021-08-04 DIAGNOSIS — K032 Erosion of teeth: Secondary | ICD-10-CM

## 2021-08-04 NOTE — Progress Notes (Signed)
Department of Dental Medicine     OUTPATIENT CONSULT  Service Date:   08/04/2021  Patient Name:   Nicole MelenaHelen Gates Date of Birth:   11/11/1941 Medical Record Number: [id number]  Referring Provider:               Earney Hamburghris McAlhany, M.D.    Plan / recommendations   Assessment There are no current signs of acute odontogenic infection including abscess, edema or erythema, or suspicious lesion requiring biopsy.  There are 2 teeth that have been previously root canal treated that are broken down with decay. Recommendations  Extractions of teeth #4 and #31 to decrease the risk of perioperative and postoperative systemic infection and complications.   Establish dental care at an outside office of the patient's choice for routine care including cleanings/periodontal therapy and periodic exams following heart surgery and recovery. Plan  Refer to oral surgeon for extractions under IV sedation as indicated. Discuss case with medical team and coordinate treatment as needed. Call if any questions or concerns arise.    Thank you for consulting with [facility] and for the opportunity to participate in this patient's treatment.  Should you have any questions or concerns, please contact the [facility] at [phone].    PROGRESS NOTE:   COVID-19 SCREENING:  The patient denies symptoms concerning for COVID-19 infection including fever, chills, cough, or newly developed shortness of breath.   HISTORY OF PRESENT ILLNESS: Nicole Gates is a very pleasant 80 y.o. female with h/o allergic rhinitis, HTN and hypothyroidism who was recently diagnosed with severe aortic stenosis and is anticipating TAVR.  The patient presents today for a medically necessary dental consultation as part of their pre-cardiac surgery work-up.   DENTAL HISTORY: The patient reports it has been 15+ years since she has last seen a dentist.  She says that she does have an upper and lower partial denture that  she had made when she used to see a dentist and she still is able to wear them, however she has had a tooth break on the lower right side which makes her lower denture not as stable.  She currently denies any dental/orofacial pain or sensitivity. Patient is able to manage oral secretions.  Patient denies dysphagia, odynophagia, dysphonia, SOB and neck pain.  Patient denies fever, rigors and malaise.   CHIEF COMPLAINT:  Here for a preoperative dental exam.   Patient Active Problem List   Diagnosis Date Noted   Severe aortic stenosis 05/27/2021   Past Medical History:  Diagnosis Date   Hypertension    Hypothyroid    Severe aortic stenosis    Past Surgical History:  Procedure Laterality Date   ABDOMINAL HYSTERECTOMY     CHOLECYSTECTOMY     RIGHT/LEFT HEART CATH AND CORONARY ANGIOGRAPHY N/A 06/09/2021   Procedure: RIGHT/LEFT HEART CATH AND CORONARY ANGIOGRAPHY;  Surgeon: Runell GessBerry, Jonathan J, MD;  [facility];  Service: Cardiovascular;  Laterality: N/A;   No Known Allergies Current Outpatient Medications  Medication Sig Dispense Refill   Ascorbic Acid (VITAMIN C PO) Take 1 capsule by mouth daily.     calcium carbonate (OS-CAL) 1250 (500 Ca) MG chewable tablet Chew 1 tablet by mouth daily.     Cholecalciferol (VITAMIN D3) 125 MCG (5000 UT) CAPS Take 5,000 Units by mouth daily.     estradiol (ESTRACE) 0.1 MG/GM vaginal cream Place 1 Applicatorful vaginally at bedtime.     fluticasone (FLONASE) 50 MCG/ACT nasal spray Place 1 spray into both  nostrils daily.     gabapentin (NEURONTIN) 300 MG capsule Take 600 mg by mouth at bedtime.     levothyroxine (SYNTHROID) 100 MCG tablet Take 100 mcg by mouth daily before breakfast.     lisinopril (ZESTRIL) 10 MG tablet Take 10 mg by mouth daily.     loratadine (CLARITIN) 10 MG tablet Take 10 mg by mouth daily.     Magnesium 400 MG TABS Take 400 mg by mouth daily.     metoprolol tartrate (LOPRESSOR) 25 MG tablet Take 1 tablet (25 mg  total) by mouth once for 1 dose. Take 2 hours prior to your CT scans. 1 tablet 0   mirabegron ER (MYRBETRIQ) 50 MG TB24 tablet Take 50 mg by mouth daily.     Multiple Vitamins-Minerals (ZINC PO) Take 1 capsule by mouth daily.     niacin 500 MG tablet Take 500 mg by mouth daily.     nystatin cream (MYCOSTATIN) Apply 1 application topically 2 (two) times daily.     pantoprazole (PROTONIX) 40 MG tablet Take 40 mg by mouth daily.     polyvinyl alcohol (LIQUIFILM TEARS) 1.4 % ophthalmic solution Place 1 drop into both eyes daily as needed for dry eyes.     POTASSIUM PO Take 595 mg by mouth daily.     No current facility-administered medications for this visit.    LABS: Lab Results  Component Value Date   WBC 6.4 06/02/2021   HGB 10.2 (L) 06/09/2021   HCT 30.0 (L) 06/09/2021   MCV 84 06/02/2021   PLT 321 06/02/2021      Component Value Date/Time   NA 142 07/25/2021 1436   K 4.6 07/25/2021 1436   CL 100 07/25/2021 1436   CO2 26 07/25/2021 1436   GLUCOSE 87 07/25/2021 1436   BUN 21 07/25/2021 1436   CREATININE 0.87 07/25/2021 1436   CALCIUM 10.8 (H) 07/25/2021 1436   No results found for: INR, PROTIME No results found for: PTT  Social History   Socioeconomic History   Marital status: Widowed    Spouse name: Not on file   Number of children: 6   Years of education: Not on file   Highest education level: Not on file  Occupational History   Occupation: Futures trader, drove a school bus, Engineer, petroleum  Tobacco Use   Smoking status: Never   Smokeless tobacco: Never  Vaping Use   Vaping Use: Never used  Substance and Sexual Activity   Alcohol use: Never   Drug use: Never   Sexual activity: Not on file  Other Topics Concern   Not on file  Social History Narrative   Not on file   Social Determinants of Health   Financial Resource Strain: Not on file  Food Insecurity: Not on file  Transportation Needs: Not on file  Physical Activity: Not on file  Stress: Not on file   Social Connections: Not on file  Intimate Partner Violence: Not on file   Family History  Problem Relation Age of Onset   Cancer Father    Diabetes Brother      REVIEW OF SYSTEMS:  Reviewed with the patient as per HPI. Psych: Patient denies having dental phobia.   VITAL SIGNS: BP 116/65 (BP Location: Right Arm, Patient Position: Sitting, Cuff Size: Normal)   Pulse 86   Temp 98.3 F (36.8 C) (Oral)    PHYSICAL EXAM: General:  Well-developed, comfortable and in no apparent distress. Neurological:  Alert and oriented to person, place and  time. Extraoral:  Facial symmetry present without any edema or erythema.  No swelling or lymphadenopathy.  TMJ asymptomatic without clicks or crepitations.  (+) TMJ: crepitus on left side upon opening Intraoral:  Soft tissues appear well-perfused and mucous membranes moist.  FOM and vestibules soft and not raised. Oral cavity without mass or lesion. No signs of infection, parulis, sinus tract, edema or erythema evident upon exam.   DENTAL EXAM: Hard tissue exam completed and charted. Overall impression:  Fair remaining dentition. Oral hygiene:  Fair    Periodontal:  Pink, healthy gingival tissue with blunted papilla.  Localized calculus accumulation on lower anterior teeth.  Gingival recession- teeth #14, #21 and #28. Caries:  #4, #6 Retained root tips:  #31 Defective restorations:  #10 composite has marginal leakage on the mesial; #13 has existing amalgam with distal fracture; #4 and #31 had core-build ups (temporary) and now have broken down/fractured to gingival margin. Endodontics:  #4 and #31 previous RCT Removable/fixed prosthodontics:  Existing upper and lower cast metal partial dentures. #4 and #31 have broken and were previous abutment teeth with clasps. Occlusion:  Unable to assess molar occlusion.  Non-functional and supra-erupted teeth numbers 13, 14 and 18. Other findings:   (+) Attrition/wear: #8 and #9 incisal, #23-#26  incisal.  (+) Abfraction/flexure: #32F(V)   RADIOGRAPHIC EXAM:  PAN and Full Mouth Series exposed and interpreted.  Condyles seated bilaterally in fossas.  No evidence of abnormal pathology.  All visualized osseous structures appear WNL. #13, #14, #18 mesially drifting. #13 and #14 appear supra-erupted.   Localized mild horizontal bone loss consistent with mild periodontitis vs gingival recession on healthy periodontium. Radiographic calculus accumulation evident. Missing teeth, existing restorations, full-coverage crown on tooth #5. Caries- #6D. #4 and #31 previous endodontic therapy, now fractured/broken from decay, loss of >50% coronal tooth structure for #4.    ASSESSMENT:  1.  Severe aortic stenosis 2.  Preoperative dental exam 3.  Missing teeth 4.  Caries 5.  Retained root tips 6.  Defective restorations 7.  Accretions on teeth 8.  Gingival recession, localized 9.  Plaque-induced gingivitis 10.  Attrition/wear 11.  Abfraction/flexure 12.  Ill-fitting prosthesis   PLAN AND RECOMMENDATIONS: I discussed the risks, benefits, and complications of various scenarios with the patient in relationship to their medical and dental conditions, which included systemic infection such as endocarditis, bacteremia or other serious issues that could potentially occur either before, during or after their anticipated heart surgery if dental/oral concerns are not addressed.  I explained that if any chronic or acute dental/oral infection(s) are addressed and subsequently not maintained following medical optimization and recovery, their risk of the previously mentioned complications are just as high and could potentially occur postoperatively.  I explained all significant findings of the dental consultation with the patient including #4 and #31 which have been previously root canal treated and since there were no full-coverage crowns placed the teeth have broken and developed recurrent decay, and the  recommended care including extractions of teeth #4 and #31 in order to optimize them for heart surgery from a dental standpoint.  The patient verbalized understanding of all findings, discussion, and recommendations. We then discussed various treatment options to include no treatment, multiple extractions with alveoloplasty, pre-prosthetic surgery as indicated, periodontal therapy, dental restorations, root canal therapy, crown and bridge therapy, implant therapy, and replacement of missing teeth as indicated.  The patient verbalized understanding of all options, and currently wishes to proceed with recommended extractions.  She would like to be  asleep or sedated for extractions if possible, so recommend referral placed to oral surgeon for extractions since there are only 2 teeth at this time that need to come out. Plan to discuss all findings and recommendations with medical team and coordinate future care as needed.  The patient will need to establish care at a dental office of her choice for routine dental care including replacement of missing teeth as needed, cleanings and exams.  Encouraged her to start looking for dentists in the area or the [facility] located in [place] to ensure she maintains optimal oral health following her heart surgery. She verbalized understanding and is agreeable to this plan.   All questions and concerns were invited and addressed.  The patient tolerated today's visit well and departed in stable condition.  I spent in excess of 120 minutes during the conduct of this consultation and >50% of this time involved direct face-to-face encounter for counseling and/or coordination of the patient's care. Morayma Godown B. Chales Salmon, D.M.D.

## 2021-08-04 NOTE — Patient Instructions (Signed)
[facility] Department of Dental Medicine Jyron Turman B. Jerie Basford, D.M.D. Phone: [phone] Fax: [phone]   It was a pleasure seeing you today!  Please refer to the information below regarding your dental visit with us.  Call us if any questions or concerns come up after you leave.   Thank you for letting us provide care for you.  If there is anything we can do for you, please let us know.    HEART VALVES AND MOUTH CARE   FACTS: If you have any infection in your mouth, it can infect your heart valve. If you heart valve is infected, you will be seriously ill. Infections in the mouth can be SILENT and do not always cause pain. Examples of infections in the mouth are gum disease, dental cavities, and abscesses. Some possible signs of infection are: Bad breath, bleeding gums, or teeth that are sensitive to sweets, hot, and/or cold. There are many other signs as well.   WHAT YOU HAVE TO DO: Brush your teeth after meals and at bedtime.  Spend at least 2 minutes brushing well, especially behind your back teeth and all around your teeth that stand alone.  Brush at the gumline also. Do not go to bed without brushing your teeth and flossing. If your gums bleed when you brush or floss, do NOT stop brushing or flossing.  Bleeding can be a sign of inflammation or irritation from bacteria.  It usually means that your gums need more attention and better cleaning.  If your dentist or Dr. Blia Totman gave you a prescription mouthwash to use, make sure to use it as directed. If you run out of the medication, get a refill at the pharmacy. If you were given any other medications or directions by your dentist, please follow them.  If you did not understand the directions or forget what you were told, please call.  We will be happy to refresh your memory. If you need antibiotics before dental procedures, make sure you take them one hour prior to every dental visit as directed.  Get a dental check-up every 4-6  months in order to keep your mouth healthy, or to find and treat any new infection. You will most likely need your teeth cleaned or gums treated at the same time. If you are not able to come in for your scheduled appointment, call your dentist as soon as possible to reschedule. If you have a problem in between dental visits, call your dentist.   QUESTIONS? Call our office during office hours [phone].    WE ARE A TEAM.  OUR GOAL IS:  HEALTHY MOUTH, HEALTHY HEART   

## 2021-08-06 ENCOUNTER — Other Ambulatory Visit: Payer: Self-pay

## 2021-08-06 ENCOUNTER — Encounter (HOSPITAL_COMMUNITY): Payer: Self-pay

## 2021-08-06 ENCOUNTER — Ambulatory Visit (HOSPITAL_COMMUNITY)
Admission: RE | Admit: 2021-08-06 | Discharge: 2021-08-06 | Disposition: A | Discharge status: Home / Self Care | Payer: Medicare HMO | Source: Ambulatory Visit | Attending: Cardiovascular Disease | Admitting: Cardiovascular Disease

## 2021-08-06 DIAGNOSIS — I350 Nonrheumatic aortic (valve) stenosis: Secondary | ICD-10-CM

## 2021-08-06 MED ORDER — METOPROLOL TARTRATE 5 MG/5ML IV SOLN
INTRAVENOUS | Status: AC
  Administered 2021-08-06: 5 mg via INTRAVENOUS
  Filled 2021-08-06: qty 15

## 2021-08-06 MED ORDER — IOHEXOL 350 MG/ML SOLN
95.0000 mL | Freq: Once | INTRAVENOUS | Status: AC | PRN
  Administered 2021-08-06: 95 mL via INTRAVENOUS

## 2021-08-06 MED ORDER — METOPROLOL TARTRATE 5 MG/5ML IV SOLN: 5.0000 mg | INTRAVENOUS | Status: DC | PRN

## 2021-09-03 ENCOUNTER — Other Ambulatory Visit: Payer: Self-pay

## 2021-09-03 ENCOUNTER — Institutional Professional Consult (permissible substitution): Payer: Medicare HMO | Admitting: Surgery

## 2021-09-03 ENCOUNTER — Encounter: Payer: Self-pay | Admitting: Surgery

## 2021-09-03 ENCOUNTER — Ambulatory Visit: Payer: Medicare HMO | Attending: Cardiovascular Disease

## 2021-09-03 VITALS — BP 127/86 | HR 91 | Resp 20 | Ht 62.0 in | Wt 215.0 lb

## 2021-09-03 DIAGNOSIS — I350 Nonrheumatic aortic (valve) stenosis: Secondary | ICD-10-CM | POA: Diagnosis not present

## 2021-09-03 DIAGNOSIS — R262 Difficulty in walking, not elsewhere classified: Secondary | ICD-10-CM | POA: Insufficient documentation

## 2021-09-03 NOTE — Therapy (Signed)
[facility] Outpatient Rehabilitation [facility] [address] [place], [place], [address] Phone: [phone]   Fax:  [phone]  Physical Therapy Evaluation  Patient Details  Name: Nicole Gates MRN: [id number] Date of Birth: 1941-06-24 Referring Provider (PT): Kathleene Hazel, MD   Encounter Date: 09/03/2021   PT End of Session - 09/03/21 1225     Visit Number 1    PT Start Time 1150    PT Stop Time 1220    PT Time Calculation (min) 30 min    Activity Tolerance Patient tolerated treatment well    Behavior During Therapy [facility] for tasks assessed/performed             Past Medical History:  Diagnosis Date   Hypertension    Hypothyroid    Severe aortic stenosis     Past Surgical History:  Procedure Laterality Date   ABDOMINAL HYSTERECTOMY     CHOLECYSTECTOMY     RIGHT/LEFT HEART CATH AND CORONARY ANGIOGRAPHY N/A 06/09/2021   Procedure: RIGHT/LEFT HEART CATH AND CORONARY ANGIOGRAPHY;  Surgeon: Runell Gess, MD;  Location: [facility];  Service: Cardiovascular;  Laterality: N/A;    There were no vitals filed for this visit.    Subjective Assessment - 09/03/21 1149     Subjective Patient reports shortness of breath that has been going on for about a year and has been having vertigo for the past couple months as well. She is experiencing chest tightness when she walks. She is able to walk in her apartment without onset of symptoms, but if she goes to walk down the hallway she has to rest frequently. She feels that her symptoms are worsening. She reports that the chest tightness and shortness of breath stop her from continuing to walk. She reports that after she eats the chest tightness is also worsened.    Patient is accompained by: Family member   daughter   Currently in Pain? No/denies                [facility] PT Assessment - 09/03/21 0001       Assessment   Medical Diagnosis I35.0 (ICD-10-CM) - Severe aortic stenosis     Referring Provider (PT) Kathleene Hazel, MD    Onset Date/Surgical Date --   >1 year   Hand Dominance Right    Next MD Visit 09/03/21    Prior Therapy yes- back      Precautions   Precautions None      Restrictions   Weight Bearing Restrictions No      Balance Screen   Has the patient fallen in the past 6 months No      Home Environment   Living Environment Private residence    Living Arrangements Alone    Type of Home Apartment    Additional Comments no stairs      Prior Function   Level of Independence Independent    Vocation Retired    Leisure TEFL teacher   Overall Cognitive Status Within Functional Limits for tasks assessed      Posture/Postural Control   Posture/Postural Control No significant limitations      AROM   Overall AROM Comments BUE/LE AROM WNL      Strength   Overall Strength Comments Gross BUE strength 4/5; Gross BLE 4-/5    Right Hand Grip (lbs) 25    Left Hand Grip (lbs) 25  [facility] Pre-Surgical Assessment - 09/03/21 0001     5 Meter Walk Test- trial 1 7 sec    5 Meter Walk Test- trial 2 6 sec.     5 Meter Walk Test- trial 3 7 sec.    5 meter walk test average 6.67 sec    4 Stage Balance Test tolerated for:  10 sec.    4 Stage Balance Test Position 2    Sit To Stand Test- trial 1 21 sec.    6 Minute Walk- Baseline yes    BP (mmHg) 165/94    HR (bpm) 78    02 Sat (%RA) 96 %    Modified Borg Scale for Dyspnea 2- Mild shortness of breath    Perceived Rate of Exertion (Borg) 7- Very, very light    6 Minute Walk Post Test yes    BP (mmHg) 150/81    HR (bpm) 102    02 Sat (%RA) 93 %    Modified Borg Scale for Dyspnea 5- Strong or hard breathing    Perceived Rate of Exertion (Borg) 15- Hard    Aerobic Endurance Distance Walked 820    Endurance additional comments 52% disability compared to age related normative value                      Objective measurements completed on examination: See  above findings.                             Plan - 09/03/21 1226     PT Frequency One time visit    Consulted and Agree with Plan of Care Patient;Family member/caregiver    Family Member Consulted daughter             Clinical Impression Statement: Pt is a 80 yo female presenting to OP PT for evaluation prior to possible TAVR surgery due to severe aortic stenosis. Pt reports onset of shortness of breath and chest tightness 1 year ago. Symptoms are  limiting ability to ambulate household and community distances. Pt presents with WNL ROM and strength and 0/10 musculoskeletal pain.  Pt ambulated 555 feet in 3.5 minutes before requesting a seated rest beak lasting 45 seconds. At time of rest, patient's HR was 103 bpm and O2 was 95 on room air. Pt reported 4/10 shortness of breath on modified scale for dyspnea. Pt able to resume after rest and ambulate an additional 270 feet.Pt ambulated a total of 820 feet in 6 minute walk and reported 5/10 SOB on modified scale for dyspena and 15/20 RPE on Borg's perceived exertion and 0/10 pain scale at the end of the walk. During the 6 minute walk test, patient's HR increased to 106 BPM and O2 saturation decreased to 93%. Based on the Short Physical Performance Battery, patient has a frailty rating of 6/12 with </= 5/12 considered frail.  Visit Diagnosis: Difficulty in walking, not elsewhere classified     Problem List Patient Active Problem List   Diagnosis Date Noted   Severe aortic stenosis 05/27/2021   Letitia Libra, PT, DPT, ATC 09/03/21 2:14 PM   [facility] [address] [place], [place], [address] Phone: [phone]   Fax:  [phone]  Name: Nicole Gates MRN: [id number] Date of Birth: Nov 21, 1941

## 2021-09-03 NOTE — Progress Notes (Signed)
Patient ID: Nicole Gates, female   DOB: 11-08-1941, 80 y.o.   MRN: [id number]  [facility]VE [facility]INIC        [address]       [place] [address]             [phone]          CARDIOTHORACIC SURGERY CONSULTATION REPORT  PCP is Sherrill Raring, MD Referring Provider is Verne Carrow, MD Primary Cardiologist is R. Hanley Hays, MD and Nanetta Batty, MD  Reason for consultation:  Severe aortic stenosis  HPI:  The patient is a 80 year old woman with a history of hypertension, hypothyroidism, peripheral neuropathy, and severe aortic stenosis that has been followed by Dr. Hanley Hays.  She had a 2D echocardiogram at [facility] on 03/31/2021 showing a mean gradient of 30 mmHg with a peak gradient of 53 mmHg.  Aortic valve area was 0.6 cm.  Left ventricular ejection fraction was 60 to 65%.  She was referred to Dr. Allyson Sabal for cardiac catheterization due to progressive exertional fatigue and shortness of breath.  Cardiac catheterization on 06/09/2021 showed normal coronary arteries.  The peak to peak gradient across aortic valve was 22.3 mmHg with a valve area by thermo- dilution of 1.27 cm and by Fick 1.35 cm.  LVEDP was 11.  PA pressure was 42/6 with a mean of 23.  Mean right atrial pressure was 3.  Mean pulmonary wedge pressure was 11.  She had a repeat 2D echocardiogram on 07/25/2021 showing a mean gradient of 27.7 mmHg and a peak gradient of 46.5 mmHg.  Aortic valve area by VTI was 0.87 cm.  Stroke-volume index was 34 with a dimensionless index of 0.28.  Left ventricular ejection fraction was 45 to 50% with global hypokinesis and grade 1 diastolic dysfunction.  The patient is here today with her daughter.  She reports progressive exertional fatigue and shortness of breath that is occurring with walking short distances.  She has had some exertional chest pressure and dizziness.  She has some edema in her ankles and feet.   She has had orthopnea.  She is widowed and retired.  She lives in an apartment in [facility] [place].  She has partial dentures on the top and bottom but reported some broken teeth.  She was seen by Dr. Chales Salmon on 08/04/2021 and extraction of 2 teeth was recommended.  She was referred to an oral surgeon for extraction.  This was done on 08/13/2021.  Past Medical History:  Diagnosis Date   Hypertension    Hypothyroid    Severe aortic stenosis     Past Surgical History:  Procedure Laterality Date   ABDOMINAL HYSTERECTOMY     CHOLECYSTECTOMY     RIGHT/LEFT HEART CATH AND CORONARY ANGIOGRAPHY N/A 06/09/2021   Procedure: RIGHT/LEFT HEART CATH AND CORONARY ANGIOGRAPHY;  Surgeon: Runell Gess, MD;  Location: [facility] INVASIVE CV LAB;  Service: Cardiovascular;  Laterality: N/A;    Family History  Problem Relation Age of Onset   Cancer Father    Diabetes Brother     Social History   Socioeconomic History   Marital status: Widowed    Spouse name: Not on file   Number of children: 6   Years of education: Not on file   Highest education level: Not on file  Occupational History   Occupation: Futures trader, drove a school bus, Engineer, petroleum  Tobacco Use   Smoking status: Never   Smokeless  tobacco: Never  Vaping Use   Vaping Use: Never used  Substance and Sexual Activity   Alcohol use: Never   Drug use: Never   Sexual activity: Not on file  Other Topics Concern   Not on file  Social History Narrative   Not on file   Social Determinants of Health   Financial Resource Strain: Not on file  Food Insecurity: Not on file  Transportation Needs: Not on file  Physical Activity: Not on file  Stress: Not on file  Social Connections: Not on file  Intimate Partner Violence: Not on file    Prior to Admission medications   Medication Sig Start Date End Date Taking? Authorizing Provider  Ascorbic Acid (VITAMIN C PO) Take 1 capsule by mouth daily.   Yes [provider]   calcium carbonate (OS-CAL) 1250 (500 Ca) MG chewable tablet Chew 1 tablet by mouth daily.   Yes [provider]  Cholecalciferol (VITAMIN D3) 125 MCG (5000 UT) CAPS Take 5,000 Units by mouth daily.   Yes [provider]  estradiol (ESTRACE) 0.1 MG/GM vaginal cream Place 1 Applicatorful vaginally at bedtime.   Yes [provider]  fluticasone (FLONASE) 50 MCG/ACT nasal spray Place 1 spray into both nostrils daily.   Yes [provider]  gabapentin (NEURONTIN) 300 MG capsule Take 600 mg by mouth at bedtime. 10/28/18  Yes [provider]  levothyroxine (SYNTHROID) 100 MCG tablet Take 100 mcg by mouth daily before breakfast.   Yes [provider]  lisinopril (ZESTRIL) 10 MG tablet Take 10 mg by mouth daily.   Yes [provider]  loratadine (CLARITIN) 10 MG tablet Take 10 mg by mouth daily.   Yes [provider]  Magnesium 400 MG TABS Take 400 mg by mouth daily.   Yes [provider]  mirabegron ER (MYRBETRIQ) 50 MG TB24 tablet Take 50 mg by mouth daily.   Yes [provider]  Multiple Vitamins-Minerals (ZINC PO) Take 1 capsule by mouth daily.   Yes [provider]  niacin 500 MG tablet Take 500 mg by mouth daily.   Yes [provider]  nystatin cream (MYCOSTATIN) Apply 1 application topically 2 (two) times daily. 04/14/21  Yes [provider]  pantoprazole (PROTONIX) 40 MG tablet Take 40 mg by mouth daily. 08/26/18  Yes [provider]  polyvinyl alcohol (LIQUIFILM TEARS) 1.4 % ophthalmic solution Place 1 drop into both eyes daily as needed for dry eyes.   Yes [provider]  POTASSIUM PO Take 595 mg by mouth daily.   Yes [provider]  metoprolol tartrate (LOPRESSOR) 25 MG tablet Take 1 tablet (25 mg total) by mouth once for 1 dose. Take 2 hours prior to your CT scans. 07/29/21 07/29/21  Janetta Hora, PA-C    Current Outpatient Medications  Medication  Sig Dispense Refill   Ascorbic Acid (VITAMIN C PO) Take 1 capsule by mouth daily.     calcium carbonate (OS-CAL) 1250 (500 Ca) MG chewable tablet Chew 1 tablet by mouth daily.     Cholecalciferol (VITAMIN D3) 125 MCG (5000 UT) CAPS Take 5,000 Units by mouth daily.     estradiol (ESTRACE) 0.1 MG/GM vaginal cream Place 1 Applicatorful vaginally at bedtime.     fluticasone (FLONASE) 50 MCG/ACT nasal spray Place 1 spray into both nostrils daily.     gabapentin (NEURONTIN) 300 MG capsule Take 600 mg by mouth at bedtime.     levothyroxine (SYNTHROID) 100 MCG tablet Take 100  mcg by mouth daily before breakfast.     lisinopril (ZESTRIL) 10 MG tablet Take 10 mg by mouth daily.     loratadine (CLARITIN) 10 MG tablet Take 10 mg by mouth daily.     Magnesium 400 MG TABS Take 400 mg by mouth daily.     mirabegron ER (MYRBETRIQ) 50 MG TB24 tablet Take 50 mg by mouth daily.     Multiple Vitamins-Minerals (ZINC PO) Take 1 capsule by mouth daily.     niacin 500 MG tablet Take 500 mg by mouth daily.     nystatin cream (MYCOSTATIN) Apply 1 application topically 2 (two) times daily.     pantoprazole (PROTONIX) 40 MG tablet Take 40 mg by mouth daily.     polyvinyl alcohol (LIQUIFILM TEARS) 1.4 % ophthalmic solution Place 1 drop into both eyes daily as needed for dry eyes.     POTASSIUM PO Take 595 mg by mouth daily.     metoprolol tartrate (LOPRESSOR) 25 MG tablet Take 1 tablet (25 mg total) by mouth once for 1 dose. Take 2 hours prior to your CT scans. 1 tablet 0   No current facility-administered medications for this visit.    No Known Allergies    Review of Systems:   General:  normal appetite, +decreased energy, no weight gain, no weight loss, no fever  Cardiac:  + chest pain with exertion, + chest pain at rest, +SOB with mild exertion, + resting SOB, no PND, + orthopnea, + palpitations, no arrhythmia, no atrial fibrillation, + LE edema, + dizzy spells, no syncope  Respiratory:  + shortness of breath,  no home oxygen, no productive cough, no dry cough, no bronchitis, + wheezing, no hemoptysis, no asthma, no pain with inspiration or cough, + sleep apnea, + CPAP at night  GI:   no difficulty swallowing, no reflux, no frequent heartburn, no hiatal hernia, no abdominal pain, no constipation, no diarrhea, no hematochezia, no hematemesis, no Gates  GU:   no dysuria,  + frequency, no urinary tract infection, no hematuria, no enlarged prostate, no kidney stones, no kidney disease  Vascular:  no pain suggestive of claudication, no pain in feet, no leg cramps, no varicose veins, no DVT, no non-healing foot ulcer  Neuro:   no stroke, no TIA's, no seizures, no headaches, no temporary blindness one eye,  no slurred speech, no peripheral neuropathy, no chronic pain, no instability of gait, no memory/cognitive dysfunction  Musculoskeletal: no arthritis, no joint swelling, no myalgias, no difficulty walking, normal mobility   Skin:   no rash, no itching, no skin infections, no pressure sores or ulcerations  Psych:   no anxiety, no depression, no nervousness, no unusual recent stress  Eyes:   no blurry vision, no floaters, no recent vision changes, no glasses or contacts  ENT:   no hearing loss, no loose or painful teeth, + partial dentures, last saw dentist 08/13/21.  Hematologic:  no easy bruising, no abnormal bleeding, no clotting disorder, no frequent epistaxis  Endocrine:  no diabetes, does not check CBG's at home     Physical Exam:   BP 127/86 (BP Location: Left Arm, Patient Position: Sitting)   Pulse 91   Resp 20   Ht  (1.575 m)   Wt 215 lb (97.5 kg)   SpO2 97% Comment: RA  BMI 39.32 kg/m   General:  well-appearing  HEENT:  Unremarkable, NCAT, PERLA, EOMI  Neck:   no JVD, no bruits, no adenopathy   Chest:  clear to auscultation, symmetrical breath sounds, no wheezes, no rhonchi   CV:   RRR, 3/6 systolic murmur RSB, No diastolic murmur  Abdomen:  soft, non-tender, no masses    Extremities:  warm, well-perfused, pulses palpable at ankle, + mild lower extremity edema in ankles  Rectal/GU  Deferred  Neuro:   Grossly non-focal and symmetrical throughout  Skin:   Clean and dry, no rashes, no breakdown  Diagnostic Tests:  ECHOCARDIOGRAM REPORT         Patient Name:   Nicole Gates Date of Exam: 07/25/2021  Medical Rec #:  [id number]      Height:       62.0 in  Accession #:    [id number]     Weight:       211.0 lb  Date of Birth:  [birth year]       BSA:          1.956 m  Patient Age:    89 years       BP:           134/74 mmHg  Patient Gender: F              HR:           86 bpm.  Exam Location:  [address]   Procedure: 2D Echo, Strain Analysis, Cardiac Doppler and Color Doppler   Indications:    I35.0 AS     History:        Patient has prior history of Echocardiogram examinations,  most                  recent 03/31/2021. AS; Risk Factors:Obesity.     Sonographer:    Samule Ohm RDCS  Referring Phys: [id number] D MCALHANY   IMPRESSIONS     1. Left ventricular ejection fraction, by estimation, is 45 to 50%. The  left ventricle has mildly decreased function. The left ventricle  demonstrates global hypokinesis. Left ventricular diastolic parameters are  consistent with Grade I diastolic  dysfunction (impaired relaxation). The average left ventricular global  longitudinal strain is -14.3 %. The global longitudinal strain is  abnormal.   2. Right ventricular systolic function is normal. The right ventricular  size is normal. There is mildly elevated pulmonary artery systolic  pressure. The estimated right ventricular systolic pressure is 42.9 mmHg.   3. Left atrial size was mildly dilated.   4. The mitral valve is degenerative. Mild mitral valve regurgitation. No  evidence of mitral stenosis.   5. Tricuspid valve regurgitation is moderate.   6. The aortic valve is calcified. There is moderate calcification of the  aortic valve. There is  moderate thickening of the aortic valve. Aortic  valve regurgitation is not visualized. Moderate aortic valve stenosis.  Aortic valve area, by VTI measures  0.87 cm. Aortic valve mean gradient measures 27.7 mmHg. Aortic valve Vmax  measures 3.41 m/s.   7. The inferior vena cava is normal in size with greater than 50%  respiratory variability, suggesting right atrial pressure of 3 mmHg.   FINDINGS   Left Ventricle: Left ventricular ejection fraction, by estimation, is 45  to 50%. The left ventricle has mildly decreased function. The left  ventricle demonstrates global hypokinesis. The average left ventricular  global longitudinal strain is -14.3 %.  The global longitudinal strain is abnormal. The left ventricular internal  cavity size was normal in size. There is no left ventricular hypertrophy.  Left ventricular diastolic parameters are consistent with  Grade I  diastolic dysfunction (impaired  relaxation).   Right Ventricle: The right ventricular size is normal. No increase in  right ventricular wall thickness. Right ventricular systolic function is  normal. There is mildly elevated pulmonary artery systolic pressure. The  tricuspid regurgitant velocity is 3.16   m/s, and with an assumed right atrial pressure of 3 mmHg, the estimated  right ventricular systolic pressure is 42.9 mmHg.   Left Atrium: Left atrial size was mildly dilated.   Right Atrium: Right atrial size was normal in size.   Pericardium: There is no evidence of pericardial effusion.   Mitral Valve: The mitral valve is degenerative in appearance. There is  moderate thickening of the mitral valve leaflet(s). There is moderate  calcification of the mitral valve leaflet(s). Mild mitral valve  regurgitation. No evidence of mitral valve  stenosis.   Tricuspid Valve: The tricuspid valve is normal in structure. Tricuspid  valve regurgitation is moderate . No evidence of tricuspid stenosis.   Aortic Valve: The aortic  valve is calcified. There is moderate  calcification of the aortic valve. There is moderate thickening of the  aortic valve. Aortic valve regurgitation is not visualized. Moderate  aortic stenosis is present. Aortic valve mean  gradient measures 27.7 mmHg. Aortic valve peak gradient measures 46.5  mmHg. Aortic valve area, by VTI measures 0.87 cm.   Pulmonic Valve: The pulmonic valve was normal in structure. Pulmonic valve  regurgitation is mild. No evidence of pulmonic stenosis.   Aorta: The aortic root is normal in size and structure.   Venous: The inferior vena cava is normal in size with greater than 50%  respiratory variability, suggesting right atrial pressure of 3 mmHg.   IAS/Shunts: No atrial level shunt detected by color flow Doppler.      LEFT VENTRICLE  PLAX 2D  LVIDd:         4.60 cm  Diastology  LVIDs:         3.20 cm  LV e' medial:    5.55 cm/s  LV PW:         1.00 cm  LV E/e' medial:  23.4  LV IVS:        1.00 cm  LV e' lateral:   9.95 cm/s  LVOT diam:     2.00 cm  LV E/e' lateral: 13.1  LV SV:         67  LV SV Index:   34       2D Longitudinal Strain  LVOT Area:     3.14 cm 2D Strain GLS Avg:     -14.3 %      RIGHT VENTRICLE             IVC  RV S prime:     23.00 cm/s  IVC diam: 1.70 cm  TAPSE (M-mode): 2.6 cm  RVSP:           42.9 mmHg   LEFT ATRIUM             Index       RIGHT ATRIUM           Index  LA diam:        5.00 cm 2.56 cm/m  RA Pressure: 3.00 mmHg  LA Vol (A2C):   66.0 ml 33.75 ml/m RA Area:     14.00 cm  LA Vol (A4C):   72.1 ml 36.87 ml/m RA Volume:   33.30 ml  17.03 ml/m  LA Biplane Vol: 72.9 ml  37.28 ml/m   AORTIC VALVE  AV Area (Vmax):    0.92 cm  AV Area (Vmean):   0.89 cm  AV Area (VTI):     0.87 cm  AV Vmax:           341.00 cm/s  AV Vmean:          246.000 cm/s  AV VTI:            0.770 m  AV Peak Grad:      46.5 mmHg  AV Mean Grad:      27.7 mmHg  LVOT Vmax:         100.00 cm/s  LVOT Vmean:        69.400 cm/s  LVOT  VTI:          0.214 m  LVOT/AV VTI ratio: 0.28     AORTA  Ao Root diam: 3.00 cm  Ao Asc diam:  3.55 cm   MITRAL VALVE                TRICUSPID VALVE  MV Area (PHT): 4.57 cm     TR Peak grad:   39.9 mmHg  MV Decel Time: 166 msec     TR Vmax:        316.00 cm/s  MV E velocity: 130.00 cm/s  Estimated RAP:  3.00 mmHg  MV A velocity: 156.00 cm/s  RVSP:           42.9 mmHg  MV E/A ratio:  0.83                              SHUNTS                              Systemic VTI:  0.21 m                              Systemic Diam: 2.00 cm   Donato Schultz MD  Electronically signed by Donato Schultz MD  Signature Date/Time: 07/25/2021/2:23:18 PM         Final     Physicians  Panel Physicians Referring Physician Case Authorizing Physician  Runell Gess, MD (Primary)     Procedures  RIGHT/LEFT HEART CATH AND CORONARY ANGIOGRAPHY   Conclusion      LV end diastolic pressure is normal.   Hemodynamic findings consistent with aortic valve stenosis.   Nicole Gates is a 80 y.o. female      [id number] LOCATION:  FACILITY: [facility]  PHYSICIAN: Nanetta Batty, M.D. 1941/10/21     DATE OF PROCEDURE:  06/09/2021   DATE OF DISCHARGE:       CARDIAC CATHETERIZATION        History obtained from chart review.Nicole Gates is a 80 y.o. moderately overweight widowed Caucasian female mother of 72, grandmother of 9 grandchildren who is accompanied by one of her daughters Herbert Seta today.  She was referred by Dr. Hanley Hays, her cardiologist, for right left heart cath because of symptomatic severe aortic stenosis.  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  She has no cardiac risk factors other than treated hypertension.  She never smoked.  Her brother did die of a myocardial infarction.  She is never had a heart attack or stroke.  She denies chest  pain but has had progressive dyspnea.  She also has obstructive sleep apnea on CPAP.  She had a 2D echo performed  by Dr. Hanley Hays revealing normal LV systolic function with severe aortic stenosis.  Valve area was 0.6 cm with a peak gradient of 57 mmHg.  A Myoview stress test was normal as well.  She has noticed increasing dyspnea on exertion when doing normal activity.     IMPRESSION: Ms. Malan has normal coronary arteries and moderate aortic stenosis.  Her LVEDP was 11 and her peak to peak aortic valve gradient was 22.3 mmHg.  Her valve area by thermodilution was 1.27 cm with an index of 0.66 cm/m and by Fick was 1.35cm with an index of 0.7 cm/m.  The right common femoral venous sheath was removed and the next closure device successfully deployed achieving hemostasis.  The right radial artery sheath was removed and a TR band was placed on the wrist to achieve hemostasis.  The patient left lab in stable condition.  She will be gently hydrated for several hours and discharged home.  I will see her back in the office for a return office visit next week.  She may be a candidate for TAVR.   Nanetta Batty. MD, [facility] 06/09/2021 2:03 PM         Procedural Details  Technical Details PROCEDURE DESCRIPTION:   The patient was brought to the second floor  [facility] cath lab in the postabsorptive state.  She was notpremedicated .  Her right wrist and groin Were prepped and shaved in usual sterile fashion. Xylocaine 1% was used for local anesthesia. A 6 French sheath was inserted into the right radial artery using standard Seldinger technique.  A 7 French sheath was inserted into the right common femoral vein.  The patient received 4500 units  of heparin intravenously. A 7 [place] balloontipped thermodilution Swan-Ganz catheter was then advanced through the right heart chambers obtaining sequential pressures and blood samples for the determination of Fick and thermal dilution cardiac outputs.  A 5 [place] TIG catheter and right Judkins catheter along with a straight wire were used for selective coronary  angiography and obtain left heart pressures.  Isovue dye was used for the entirety of the case.  Retrograde aortic and left ventricular and pullback pressures were recorded.  Radial cocktail was administered via the SideArm sheath.  A MYNX  closure device was successfully deployed in the right common femoral vein at the end of the case achieving hemostasis. Estimated blood loss <50 mL.   During this procedure no sedation was administered.   Medications (Filter: Administrations occurring from 1230 to 1352 on 06/09/21)  important  Continuous medications are totaled by the amount administered until 06/09/21 1352.   Heparin (Porcine) in NaCl 1000-0.9 UT/500ML-% SOLN (mL) Total volume:  1,000 mL Date/Time Rate/Dose/Volume Action   06/09/21 1314 500 mL Given   1315 500 mL Given    lidocaine (PF) (XYLOCAINE) 1 % injection (mL) Total volume:  12 mL Date/Time Rate/Dose/Volume Action   06/09/21 1321 10 mL Given   1328 2 mL Given    Radial Cocktail (Verapamil 5 mg, NTG, Lidocaine) (mL) Total volume:  6 mL Date/Time Rate/Dose/Volume Action   06/09/21 1329 6 mL Given    heparin sodium (porcine) injection (Units) Total dose:  4,500 Units Date/Time Rate/Dose/Volume Action   06/09/21 1331 4,500 Units Given    iohexol (OMNIPAQUE) 350 MG/ML injection (mL) Total volume:  35 mL Date/Time Rate/Dose/Volume Action   06/09/21 1349  35 mL Given    Radiation/Fluoro  Fluoro time: 4.5 (min) DAP: 09811 (mGycm2) Cumulative Air Kerma: 767 (mGy) Coronary Findings  Diagnostic Dominance: Right No diagnostic findings have been documented. Intervention  No interventions have been documented. Right Heart  Right Heart Pressures Hemodynamic findings consistent with aortic valve stenosis. LV EDP is normal.   Coronary Diagrams  Diagnostic Dominance: Right Intervention  Implants     Vascular Products  Closure Mynx Control 8f/25f - [id number] - Implanted Inventory item: CLOSURE [facility] CONTROL 35F/65F  Model/Cat number: [id number]  Manufacturer: [employer] Lot number: [id number]  Device identifier: [id number] Device identifier type: GS1  GUDID Information  Request status Successful    Brand name: [facility] CONTROL Version/Model: [id number]  Company name: [employer] MRI safety info as of 06/09/21: MR Safe  Contains dry or latex rubber: No    GMDN P.T. name: Wound hydrogel dressing, non-antimicrobial     As of 06/09/2021  Status: Implanted       Syngo Images   Show images for CARDIAC CATHETERIZATION Images on Long Term Storage   Show images for Gargi, Berch to Procedure Log  Procedure Log    Hemo Data  Flowsheet Row Most Recent Value  Fick Cardiac Output 8.33 L/min  Fick Cardiac Output Index 4.35 (L/min)/BSA  Thermal Cardiac Output 7.85 L/min  Thermal Cardiac Output Index 4.1 (L/min)/BSA  Aortic Mean Gradient 27.83 mmHg  Aortic Peak Gradient 23 mmHg  Aortic Valve Area 1.27  Aortic Value Area Index 0.66 cm2/BSA  RA A Wave 6 mmHg  RA V Wave 5 mmHg  RA Mean 3 mmHg  RV Systolic Pressure 43 mmHg  RV Diastolic Pressure -2 mmHg  RV EDP 5 mmHg  PA Systolic Pressure 42 mmHg  PA Diastolic Pressure 6 mmHg  PA Mean 23 mmHg  PW A Wave 12 mmHg  PW V Wave 7 mmHg  PW Mean 11 mmHg  AO Systolic Pressure 136 mmHg  AO Diastolic Pressure 65 mmHg  AO Mean 96 mmHg  LV Systolic Pressure 151 mmHg  LV Diastolic Pressure -1 mmHg  LV EDP 0 mmHg  AOp Systolic Pressure 137 mmHg  AOp Diastolic Pressure 63 mmHg  AOp Mean Pressure 93 mmHg  LVp Systolic Pressure 155 mmHg  LVp Diastolic Pressure 0 mmHg  LVp EDP Pressure 11 mmHg  TPVR Index 5.61 HRUI  TSVR Index 23.41 HRUI  PVR SVR Ratio 0.13  TPVR/TSVR Ratio 0.24    ADDENDUM REPORT: 08/10/2021 12:40   CLINICAL DATA:  Pre-op transcatheter aortic valve replacement (TAVR)   EXAM: Cardiac TAVR CT   TECHNIQUE: The patient was scanned on a Siemens Force 192 slice scanner. A 120 kV retrospective scan was triggered in  the descending thoracic aorta at 111 HU's. Gantry rotation speed was 270 msecs and collimation was .9 mm. The 3D data set was reconstructed in 5% intervals of the R-R cycle. Systolic and diastolic phases were analyzed on a dedicated work station using MPR, MIP and VRT modes. The patient received 95mL OMNIPAQUE IOHEXOL 350 MG/ML SOLN of contrast.   FINDINGS: Aortic Valve: 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 Moderately thickened, moderately calcified aortic valve cusps.   AV calcium score: 487   Virtual Basal Annulus Measurements:   Maximum/Minimum Diameter: 28 x 22.2 mm   Perimeter:  78 mm   Area: 467 mm2   No significant LVOT calcifications.   Based on these measurements, the annulus would be suitable for a 26 mm Sapien 3 valve.  Sinus of Valsalva Measurements:   Non-coronary: 29 mm   Right - coronary:  29 mm   Left - coronary:  30 mm   Sinus of Valsalva Height:   Left: 17.8 mm   Right: 19.0 mm   Aorta: Conventional 3 vessel branch pattern of aortic arch, moderate aortic atherosclerosis primarily in aortic arch.   Sinotubular Junction:  28 mm   Ascending Thoracic Aorta:  34 mm   Aortic Arch:  26 mm   Descending Thoracic Aorta:  25 mm   Coronary Artery Height above Annulus:   Left Main: 12.1 mm   Right Coronary: 13.1 mm   Coronary Arteries: Normal coronary origin. Right dominance. The study was performed without use of NTG and insufficient for plaque evaluation. Calcifications in the LAD and RCA.   Optimum Fluoroscopic Angle for Delivery: LAO 14, CAU 9   No LA appendage thrombus.   Normal variant pulmonary vein drainage, with a common antrum of the left sided pulmonary veins before draining into the left atrium.   Mild mitral annular calcifications.   IMPRESSION: 1. 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 Moderately thickened, moderately calcified aortic valve cusps.   2.  AV calcium  score: 487   3. Annulus area: 467 mm2, No significant LVOT calcifications. The annulus would be suitable for a 26 mm Sapien 3 valve.   4.  Sufficient coronary artery heights from annulus for deployment.   5. Optimum Fluoroscopic Angle for Delivery: LAO 14, CAU 9     Electronically Signed   By: Weston Brass M.D.   On: 08/10/2021 12:40    Addended by Parke Poisson, MD on 08/10/2021 12:43 PM   Study Result  Narrative & Impression  EXAM: OVER-READ INTERPRETATION  CT CHEST   The following report is an over-read performed by radiologist Dr. Cleone Slim of [facility] on 08/06/2021. This over-read does not include interpretation of cardiac or coronary anatomy or pathology. The coronary calcium score/coronary CTA interpretation by the cardiologist is attached.   COMPARISON:  08/24/2018 chest radiograph.   FINDINGS: Please see the separate concurrent chest CT angiogram report for details.   IMPRESSION: Please see the separate concurrent chest CT angiogram report for details.   Electronically Signed: By: Delbert Phenix M.D. On: 08/07/2021 08:26      Narrative & Impression  CLINICAL DATA:  Severe aortic stenosis. TAVR pre-intervention planning.   EXAM: CT ANGIOGRAPHY CHEST, ABDOMEN AND PELVIS   TECHNIQUE: Multidetector CT imaging through the chest, abdomen and pelvis was performed using the standard protocol during bolus administration of intravenous contrast. Multiplanar reconstructed images and MIPs were obtained and reviewed to evaluate the vascular anatomy.   CONTRAST:  36mL OMNIPAQUE IOHEXOL 350 MG/ML SOLN   COMPARISON:  09/09/2018 CT abdomen/pelvis.   FINDINGS: CTA CHEST FINDINGS   Cardiovascular: Mild cardiomegaly. No significant pericardial effusion/thickening. Left anterior descending coronary atherosclerosis. Diffuse thickening and calcification of the aortic valve. Atherosclerotic nonaneurysmal thoracic aorta. Top-normal caliber  main pulmonary artery (3.4 cm diameter). No central pulmonary emboli.   Mediastinum/Nodes: No discrete thyroid nodules. Unremarkable esophagus. No pathologically enlarged axillary, mediastinal or hilar lymph nodes.   Lungs/Pleura: No pneumothorax. No pleural effusion. Two scattered tiny peripheral left upper lobe ground-glass nodules, largest 3 mm (series 5/image 58). No acute consolidative airspace disease or lung masses. Mild compressive atelectasis at the medial basilar left lower lobe due to the hiatal hernia.   Musculoskeletal: No aggressive appearing focal osseous lesions. Mild thoracic spondylosis.   CTA ABDOMEN  AND PELVIS FINDINGS   Hepatobiliary: Normal liver with no liver mass. Cholecystectomy. Bile ducts are within normal post cholecystectomy limits with CBD diameter 9 mm.   Pancreas: Diffuse fatty atrophy, with no mass or duct dilation.   Spleen: Normal size. No mass.   Adrenals/Urinary Tract: Normal adrenals. No hydronephrosis. No contour deforming renal masses. Normal bladder.   Stomach/Bowel: Large hiatal hernia. Stomach is nondistended and otherwise normal. Moderate right inguinal hernia contains small bowel loops. No small bowel dilatation, caliber transition, wall thickening or pneumatosis. Normal appendix. Marked diffuse colonic diverticulosis with no large bowel wall thickening or significant pericolonic fat stranding.   Vascular/Lymphatic: Mildly atherosclerotic nonaneurysmal abdominal aorta. No pathologically enlarged lymph nodes in the abdomen or pelvis.   Reproductive: Status post hysterectomy, with no abnormal findings at the vaginal cuff. Simple 1.7 cm right adnexal cyst (series 4/image 177). No left adnexal masses.   Other: No pneumoperitoneum, ascites or focal fluid collection.   Musculoskeletal: No aggressive appearing focal osseous lesions. Moderate degenerative disc disease at L5-S1.   VASCULAR MEASUREMENTS PERTINENT TO TAVR:    AORTA:   Minimal Aortic Diameter-17.7 x 17.1 mm   Severity of Aortic Calcification-mild-to-moderate   RIGHT PELVIS:   Right Common Iliac Artery -   Minimal Diameter-11.0 x 10.9 mm   Tortuosity-mild   Calcification-mild   Right External Iliac Artery -   Minimal Diameter-8.8 x 8.5 mm   Tortuosity-moderate   Calcification-none   Right Common Femoral Artery -   Minimal Diameter-9.4 x 9.3 mm   Tortuosity-mild   Calcification-mild   LEFT PELVIS:   Left Common Iliac Artery -   Minimal Diameter-11.0 x 9.6 mm   Tortuosity-mild   Calcification-moderate   Left External Iliac Artery -   Minimal Diameter-9.4 x 8.6 mm   Tortuosity-mild   Calcification-none   Left Common Femoral Artery -   Minimal Diameter-8.3 x 8.2 mm   Tortuosity-mild   Calcification-mild   Review of the MIP images confirms the above findings.   IMPRESSION: 1. Vascular findings and measurements pertinent to potential TAVR procedure, as detailed. 2. Diffuse thickening and calcification of the aortic valve, compatible with the reported history of severe aortic stenosis. 3. Mild cardiomegaly. One vessel coronary atherosclerosis. 4. Large hiatal hernia. 5. Marked diffuse colonic diverticulosis. 6. Moderate right inguinal hernia contains small bowel loops. No evidence of acute bowel complication. 7. Simple 1.7 cm right adnexal cyst. No follow-up imaging is recommended. Reference: JACR 2020 Feb;17(2):248-254 8. Two scattered tiny peripheral left upper lobe ground-glass nodules, largest 3 mm. No follow-up required. 9. Aortic Atherosclerosis (ICD10-I70.0).     Electronically Signed   By: Delbert Phenix M.D.   On: 08/07/2021 08:50     STS Risk Score: Risk of Mortality: 1.859% Renal Failure: 1.599% Permanent Stroke: 1.052% Prolonged Ventilation: 5.725% DSW Infection: 0.119% Reoperation: 2.401% Morbidity or Mortality: 9.606% Short Length of Stay: 36.061% Long Length of  Stay: 4.543%   Impression:  This 80 year old woman has stage D, severe, symptomatic aortic stenosis with New York Heart Association class III symptoms of progressive exertional fatigue and shortness of breath consistent with chronic diastolic congestive heart failure.  She has also been having substernal chest discomfort, dizziness, and lower extremity edema.  She feels that her symptoms are continuing to worsen and she has difficulty walking down the hall in her apartment building to visit friends.  I have personally reviewed her 2D echocardiogram, cardiac catheterization, and CTA studies.  Her echocardiogram shows a moderately calcified and thickened aortic valve with  restricted leaflet mobility.  The mean gradient was 27.7 mmHg with a valve area by VTI of 0.87 cm consistent with severe low gradient aortic stenosis.  Her valve clearly looks severely stenotic on echocardiogram.  Cardiac catheterization shows normal coronary arteries.  The peak to peak gradient was measured at 22 mmHg.  I agree that aortic valve replacement is indicated in this patient for relief of her worsening symptoms and to prevent progressive left ventricular deterioration.  Her left ventricular ejection fraction has decreased from 60 to 65% in May to 45 to 50% on her most recent echo.  Given her age I think transcatheter aortic valve replacement would be a reasonable option for treating her.  Her gated cardiac CTA shows anatomy suitable for TAVR using a 26 mm SAPIEN 3 valve.  Her abdominal and pelvic CTA shows adequate pelvic vascular anatomy to allow transfemoral insertion.  The patient and her daughter were counseled at length regarding treatment alternatives for management of severe symptomatic aortic stenosis. The risks and benefits of surgical intervention has been discussed in detail. Long-term prognosis with medical therapy was discussed. Alternative approaches such as conventional surgical aortic valve replacement,  transcatheter aortic valve replacement, and palliative medical therapy were compared and contrasted at length. This discussion was placed in the context of the patient's own specific clinical presentation and past medical history. All of their questions have been addressed.   Following the decision to proceed with transcatheter aortic valve replacement, a discussion was held regarding what types of management strategies would be attempted intraoperatively in the event of life-threatening complications, including whether or not the patient would be considered a candidate for the use of cardiopulmonary bypass and/or conversion to open sternotomy for attempted surgical intervention.  I think she would be a candidate for emergent sternotomy to manage any intraoperative complications.  The patient is aware of the fact that transient use of cardiopulmonary bypass may be necessary. The patient has been advised of a variety of complications that might develop including but not limited to risks of death, stroke, paravalvular leak, aortic dissection or other major vascular complications, aortic annulus rupture, device embolization, cardiac rupture or perforation, mitral regurgitation, acute myocardial infarction, arrhythmia, heart block or bradycardia requiring permanent pacemaker placement, congestive heart failure, respiratory failure, renal failure, pneumonia, infection, other late complications related to structural valve deterioration or migration, or other complications that might ultimately cause a temporary or permanent loss of functional independence or other long term morbidity. The patient provides full informed consent for the procedure as described and all questions were answered.      Plan:  She will be scheduled for transfemoral transcatheter aortic valve replacement using a SAPIEN 3 valve.  I spent 60 minutes performing this consultation and > 50% of this time was spent face to face counseling and  coordinating the care of this patient's severe symptomatic aortic stenosis.   Alleen Borne, MD 09/03/2021

## 2021-09-05 ENCOUNTER — Other Ambulatory Visit: Payer: Self-pay

## 2021-09-05 DIAGNOSIS — I350 Nonrheumatic aortic (valve) stenosis: Secondary | ICD-10-CM

## 2021-09-11 NOTE — Progress Notes (Signed)
Surgical Instructions    Your procedure is scheduled on Tuesday, October 25th, 2022.   Report to [facility] Main Entrance "A" at 10:15 A.M., then check in with the Admitting office.  Call this number if you have problems the morning of surgery:  [phone]   If you have any questions prior to your surgery date call [phone]: Open Monday-Friday 8am-4pm    Remember:  Do not eat or drink after midnight the night before your surgery    STOP now taking any Aspirin (unless otherwise instructed by your surgeon), Aleve, Naproxen, Ibuprofen, Motrin, Advil, Goody's, BC's, all herbal medications, fish oil, and all vitamins.   Continue taking all other medications without change through the day before surgery. On the morning of surgery take only Synthroid with a sip of water.    After your COVID test   You are not required to quarantine however you are required to wear a well-fitting mask when you are out and around people not in your household.  If your mask becomes wet or soiled, replace with a new one.  Wash your hands often with soap and water for 20 seconds or clean your hands with an alcohol-based hand sanitizer that contains at least 60% alcohol.  Do not share personal items.  Notify your provider: if you are in close contact with someone who has COVID  or if you develop a fever of 100.4 or greater, sneezing, cough, sore throat, shortness of breath or body aches.    The day of surgery:          Do not wear jewelry or makeup Do not wear lotions, powders, perfumes, or deodorant. Do not shave 48 hours prior to surgery.   Do not bring valuables to the hospital. DO Not wear nail polish, gel polish, artificial nails, or any other type of covering on natural nails including finger and toenails. If patients have artificial nails, gel coating, etc. that need to be removed by a nail salon, please have this removed prior to surgery or surgery may need to be canceled/delayed if the  surgeon/ anesthesia feels like the patient is unable to be adequately monitored.              [facility] is not responsible for any belongings or valuables.  Do NOT Smoke (Tobacco/Vaping)  24 hours prior to your procedure  If you use a CPAP at night, you may bring your mask for your overnight stay.   Contacts, glasses, hearing aids, dentures or partials may not be worn into surgery, please bring cases for these belongings   For patients admitted to the hospital, discharge time will be determined by your treatment team.   Patients discharged the day of surgery will not be allowed to drive home, and someone needs to stay with them for 24 hours.  NO VISITORS WILL BE ALLOWED IN PRE-OP WHERE PATIENTS ARE PREPPED FOR SURGERY.  ONLY 1 SUPPORT PERSON MAY BE PRESENT IN THE WAITING ROOM WHILE YOU ARE IN SURGERY.  IF YOU ARE TO BE ADMITTED, ONCE YOU ARE IN YOUR ROOM YOU WILL BE ALLOWED TWO (2) VISITORS. 1 (ONE) VISITOR MAY STAY OVERNIGHT BUT MUST ARRIVE TO THE ROOM BY 8pm.  Minor children may have two parents present. Special consideration for safety and communication needs will be reviewed on a case by case basis.  Special instructions:    Oral Hygiene is also important to reduce your risk of infection.  Remember - BRUSH YOUR TEETH THE MORNING OF SURGERY  WITH YOUR REGULAR TOOTHPASTE   [facility]- Preparing For Surgery  Before surgery, you can play an important role. Because skin is not sterile, your skin needs to be as free of germs as possible. You can reduce the number of germs on your skin by washing with CHG (chlorahexidine gluconate) Soap before surgery.  CHG is an antiseptic cleaner which kills germs and bonds with the skin to continue killing germs even after washing.     Please do not use if you have an allergy to CHG or antibacterial soaps. If your skin becomes reddened/irritated stop using the CHG.  Do not shave (including legs and underarms) for at least 48 hours prior to first CHG  shower. It is OK to shave your face.  Please follow these instructions carefully.     Shower the NIGHT BEFORE SURGERY and the MORNING OF SURGERY with CHG Soap.   If you chose to wash your hair, wash your hair first as usual with your normal shampoo. After you shampoo, rinse your hair and body thoroughly to remove the shampoo.  Then [employer] and genitals (private parts) with your normal soap and rinse thoroughly to remove soap.  After that Use CHG Soap as you would any other liquid soap. You can apply CHG directly to the skin and wash gently with a scrungie or a clean washcloth.   Apply the CHG Soap to your body ONLY FROM THE NECK DOWN.  Do not use on open wounds or open sores. Avoid contact with your eyes, ears, mouth and genitals (private parts). Wash Face and genitals (private parts)  with your normal soap.   Wash thoroughly, paying special attention to the area where your surgery will be performed.  Thoroughly rinse your body with warm water from the neck down.  DO NOT shower/wash with your normal soap after using and rinsing off the CHG Soap.  Pat yourself dry with a CLEAN TOWEL.  Wear CLEAN PAJAMAS to bed the night before surgery  Place CLEAN SHEETS on your bed the night before your surgery  DO NOT SLEEP WITH PETS.   Day of Surgery:  Take a shower with CHG soap. Wear Clean/Comfortable clothing the morning of surgery Do not apply any deodorants/lotions.   Remember to brush your teeth WITH YOUR REGULAR TOOTHPASTE.   Please read over the following fact sheets that you were given.

## 2021-09-12 ENCOUNTER — Ambulatory Visit (HOSPITAL_COMMUNITY)
Admission: RE | Admit: 2021-09-12 | Discharge: 2021-09-12 | Disposition: A | Discharge status: Home / Self Care | Payer: Medicare HMO | Source: Ambulatory Visit | Attending: Cardiovascular Disease | Admitting: Cardiovascular Disease

## 2021-09-12 ENCOUNTER — Encounter (HOSPITAL_COMMUNITY)
Admission: RE | Admit: 2021-09-12 | Discharge: 2021-09-12 | Disposition: A | Discharge status: Home / Self Care | Payer: Medicare HMO | Source: Ambulatory Visit | Attending: Cardiovascular Disease | Admitting: Cardiovascular Disease

## 2021-09-12 ENCOUNTER — Other Ambulatory Visit: Payer: Self-pay

## 2021-09-12 ENCOUNTER — Encounter (HOSPITAL_COMMUNITY): Payer: Self-pay

## 2021-09-12 VITALS — BP 149/63 | HR 84 | Temp 97.9°F | Resp 19 | Ht 61.0 in | Wt 213.3 lb

## 2021-09-12 DIAGNOSIS — Z20822 Contact with and (suspected) exposure to covid-19: Secondary | ICD-10-CM | POA: Insufficient documentation

## 2021-09-12 DIAGNOSIS — Z01818 Encounter for other preprocedural examination: Secondary | ICD-10-CM | POA: Diagnosis present

## 2021-09-12 DIAGNOSIS — I350 Nonrheumatic aortic (valve) stenosis: Secondary | ICD-10-CM | POA: Diagnosis not present

## 2021-09-12 HISTORY — DX: Gastro-esophageal reflux disease without esophagitis: K21.9

## 2021-09-12 HISTORY — DX: Sleep apnea, unspecified: G47.30

## 2021-09-12 HISTORY — DX: Cardiac murmur, unspecified: R01.1

## 2021-09-12 LAB — COMPREHENSIVE METABOLIC PANEL
ALT: 14 U/L (ref 0–44)
AST: 19 U/L (ref 15–41)
Albumin: 4 g/dL (ref 3.5–5.0)
Alkaline Phosphatase: 46 U/L (ref 38–126)
Anion gap: 11 (ref 5–15)
BUN: 20 mg/dL (ref 8–23)
CO2: 23 mmol/L (ref 22–32)
Calcium: 9.9 mg/dL (ref 8.9–10.3)
Chloride: 105 mmol/L (ref 98–111)
Creatinine, Ser: 0.73 mg/dL (ref 0.44–1.00)
GFR, Estimated: >60 mL/min (ref >60)
Glucose, Bld: 106 mg/dL — ABNORMAL HIGH (ref 70–99)
Potassium: 4.5 mmol/L (ref 3.5–5.1)
Sodium: 139 mmol/L (ref 135–145)
Total Bilirubin: 0.5 mg/dL (ref 0.3–1.2)
Total Protein: 7 g/dL (ref 6.5–8.1)

## 2021-09-12 LAB — CBC
HCT: 35.9 % — ABNORMAL LOW (ref 36.0–46.0)
Hemoglobin: 10.4 g/dL — ABNORMAL LOW (ref 12.0–15.0)
MCH: 25.1 pg — ABNORMAL LOW (ref 26.0–34.0)
MCHC: 29 g/dL — ABNORMAL LOW (ref 30.0–36.0)
MCV: 86.5 fL (ref 80.0–100.0)
Platelets: 366 10*3/uL (ref 150–400)
RBC: 4.15 MIL/uL (ref 3.87–5.11)
RDW: 15.2 % (ref 11.5–15.5)
WBC: 6.8 10*3/uL (ref 4.0–10.5)
nRBC: 0 % (ref 0.0–0.2)

## 2021-09-12 LAB — URINALYSIS, ROUTINE W REFLEX MICROSCOPIC
Bacteria, UA: NONE SEEN
Bilirubin Urine: NEGATIVE
Glucose, UA: NEGATIVE mg/dL
Hgb urine dipstick: NEGATIVE
Ketones, ur: NEGATIVE mg/dL
Nitrite: NEGATIVE
Protein, ur: NEGATIVE mg/dL
Specific Gravity, Urine: 1.005 (ref 1.005–1.030)
pH: 6 (ref 5.0–8.0)

## 2021-09-12 LAB — TYPE AND SCREEN
ABO/RH(D): A POS
Antibody Screen: NEGATIVE

## 2021-09-12 LAB — BLOOD GAS, ARTERIAL
Acid-Base Excess: 2.6 mmol/L — ABNORMAL HIGH (ref 0.0–2.0)
Bicarbonate: 27 mmol/L (ref 20.0–28.0)
Drawn by: 602861
FIO2: 21
O2 Saturation: 97.2 %
Patient temperature: 37
pCO2 arterial: 44.6 mmHg (ref 32.0–48.0)
pH, Arterial: 7.4 (ref 7.350–7.450)
pO2, Arterial: 93.2 mmHg (ref 83.0–108.0)

## 2021-09-12 LAB — SURGICAL PCR SCREEN
MRSA, PCR: NEGATIVE
Staphylococcus aureus: NEGATIVE

## 2021-09-12 LAB — SARS CORONAVIRUS 2 (TAT 6-24 HRS): SARS Coronavirus 2: NEGATIVE

## 2021-09-12 LAB — PROTIME-INR
INR: 1.1 (ref 0.8–1.2)
Prothrombin Time: 14.4 seconds (ref 11.4–15.2)

## 2021-09-12 NOTE — Progress Notes (Signed)
PCP - Chari Manning PA Cardiologist - Lollie Marrow MD                       Nanetta Batty MD  PPM/ICD - denies Device Orders -  Rep Notified -   Chest x-ray - 09/12/21 EKG - 09/12/21 Stress Test - 03/18/20 ECHO - 07/25/21 Cardiac Cath - 06/09/21  Sleep Study - 12/11/20 CPAP - yes  Fasting Blood Sugar - n/a Checks Blood Sugar _____ times a day  Blood Thinner Instructions:n/a Aspirin Instructions:n/a  ERAS Protcol -no PRE-SURGERY Ensure or G2-   COVID TEST- 09/12/21   Anesthesia review:   Patient denies shortness of breath, fever, cough and chest pain at PAT appointment   All instructions explained to the patient, with a verbal understanding of the material. Patient agrees to go over the instructions while at home for a better understanding. Patient also instructed to self quarantine after being tested for COVID-19. The opportunity to ask questions was provided.

## 2021-09-15 MED ORDER — HEPARIN 30,000 UNITS/1000 ML (OHS) CELLSAVER SOLUTION
Status: DC
  Filled 2021-09-15: qty 1000

## 2021-09-15 MED ORDER — POTASSIUM CHLORIDE 2 MEQ/ML IV SOLN
80.0000 meq | INTRAVENOUS | Status: DC
  Filled 2021-09-15: qty 40

## 2021-09-15 MED ORDER — CEFAZOLIN SODIUM-DEXTROSE 2-4 GM/100ML-% IV SOLN
2.0000 g | INTRAVENOUS | Status: AC
  Administered 2021-09-16: 2 g via INTRAVENOUS
  Filled 2021-09-15 (×2): qty 100

## 2021-09-15 MED ORDER — NOREPINEPHRINE 4 MG/250ML-% IV SOLN
0.0000 ug/min | INTRAVENOUS | Status: DC
  Filled 2021-09-15: qty 250

## 2021-09-15 MED ORDER — DEXMEDETOMIDINE HCL IN NACL 400 MCG/100ML IV SOLN
0.1000 ug/kg/h | INTRAVENOUS | Status: AC
Start: 2021-09-16 — End: 2021-09-16
  Administered 2021-09-16: 96.8 ug via INTRAVENOUS
  Administered 2021-09-16: 1 ug/kg/h via INTRAVENOUS
  Filled 2021-09-15 (×2): qty 100

## 2021-09-15 MED ORDER — MAGNESIUM SULFATE 50 % IJ SOLN
40.0000 meq | INTRAMUSCULAR | Status: DC
  Filled 2021-09-15: qty 9.85

## 2021-09-16 ENCOUNTER — Inpatient Hospital Stay (HOSPITAL_COMMUNITY)
Admission: RE | Admit: 2021-09-16 | Discharge: 2021-09-16 | Disposition: A | Discharge status: Home / Self Care | Payer: Medicare HMO | Source: Ambulatory Visit | Attending: Cardiovascular Disease | Admitting: Cardiovascular Disease

## 2021-09-16 ENCOUNTER — Inpatient Hospital Stay (HOSPITAL_COMMUNITY): Payer: Medicare HMO | Admitting: Physician Assistant

## 2021-09-16 ENCOUNTER — Encounter (HOSPITAL_COMMUNITY)
Admission: RE | Disposition: A | Discharge status: Home / Self Care | Payer: Self-pay | Source: Home / Self Care | Attending: Cardiovascular Disease

## 2021-09-16 ENCOUNTER — Other Ambulatory Visit: Payer: Self-pay

## 2021-09-16 ENCOUNTER — Inpatient Hospital Stay (HOSPITAL_COMMUNITY)
Admission: RE | Admit: 2021-09-16 | Discharge: 2021-09-17 | DRG: 267 | Disposition: A | Discharge status: Home / Self Care | Payer: Medicare HMO | Attending: Cardiovascular Disease | Admitting: Cardiovascular Disease

## 2021-09-16 ENCOUNTER — Encounter (HOSPITAL_COMMUNITY): Payer: Self-pay | Admitting: Cardiovascular Disease

## 2021-09-16 ENCOUNTER — Inpatient Hospital Stay (HOSPITAL_COMMUNITY): Payer: Medicare HMO | Admitting: Anesthesiology

## 2021-09-16 DIAGNOSIS — E039 Hypothyroidism, unspecified: Secondary | ICD-10-CM

## 2021-09-16 DIAGNOSIS — Z952 Presence of prosthetic heart valve: Secondary | ICD-10-CM | POA: Diagnosis not present

## 2021-09-16 DIAGNOSIS — G629 Polyneuropathy, unspecified: Secondary | ICD-10-CM

## 2021-09-16 DIAGNOSIS — Z90710 Acquired absence of both cervix and uterus: Secondary | ICD-10-CM | POA: Diagnosis not present

## 2021-09-16 DIAGNOSIS — Z6839 Body mass index (BMI) 39.0-39.9, adult: Secondary | ICD-10-CM | POA: Diagnosis not present

## 2021-09-16 DIAGNOSIS — G4733 Obstructive sleep apnea (adult) (pediatric): Secondary | ICD-10-CM | POA: Diagnosis present

## 2021-09-16 DIAGNOSIS — I350 Nonrheumatic aortic (valve) stenosis: Secondary | ICD-10-CM

## 2021-09-16 DIAGNOSIS — I10 Essential (primary) hypertension: Secondary | ICD-10-CM

## 2021-09-16 DIAGNOSIS — Z79899 Other long term (current) drug therapy: Secondary | ICD-10-CM | POA: Diagnosis not present

## 2021-09-16 DIAGNOSIS — I447 Left bundle-branch block, unspecified: Secondary | ICD-10-CM | POA: Diagnosis present

## 2021-09-16 DIAGNOSIS — E669 Obesity, unspecified: Secondary | ICD-10-CM | POA: Diagnosis present

## 2021-09-16 DIAGNOSIS — Z8249 Family history of ischemic heart disease and other diseases of the circulatory system: Secondary | ICD-10-CM | POA: Diagnosis not present

## 2021-09-16 DIAGNOSIS — Z006 Encounter for examination for normal comparison and control in clinical research program: Secondary | ICD-10-CM

## 2021-09-16 DIAGNOSIS — Z79890 Hormone replacement therapy (postmenopausal): Secondary | ICD-10-CM | POA: Diagnosis not present

## 2021-09-16 DIAGNOSIS — Z9049 Acquired absence of other specified parts of digestive tract: Secondary | ICD-10-CM | POA: Diagnosis not present

## 2021-09-16 HISTORY — DX: Presence of prosthetic heart valve: Z95.2

## 2021-09-16 HISTORY — PX: TEE WITHOUT CARDIOVERSION: SHX5443

## 2021-09-16 HISTORY — PX: TRANSCATHETER AORTIC VALVE REPLACEMENT, TRANSFEMORAL: SHX6400

## 2021-09-16 HISTORY — DX: Nonrheumatic aortic (valve) stenosis: I35.0

## 2021-09-16 LAB — ECHOCARDIOGRAM LIMITED
AR max vel: 3.57 cm2
AV Area VTI: 3.33 cm2
AV Area mean vel: 2.13 cm2
AV Mean grad: 4 mmHg
AV Peak grad: 8.2 mmHg
Ao pk vel: 1.43 m/s
S' Lateral: 3.7 cm

## 2021-09-16 LAB — POCT I-STAT, CHEM 8
BUN: 19 mg/dL (ref 8–23)
BUN: 20 mg/dL (ref 8–23)
BUN: 19 mg/dL (ref 8–23)
BUN: 18 mg/dL (ref 8–23)
Calcium, Ion: 1.28 mmol/L (ref 1.15–1.40)
Calcium, Ion: 1.31 mmol/L (ref 1.15–1.40)
Calcium, Ion: 1.3 mmol/L (ref 1.15–1.40)
Calcium, Ion: 1.32 mmol/L (ref 1.15–1.40)
Chloride: 105 mmol/L (ref 98–111)
Chloride: 105 mmol/L (ref 98–111)
Chloride: 104 mmol/L (ref 98–111)
Chloride: 104 mmol/L (ref 98–111)
Creatinine, Ser: 0.8 mg/dL (ref 0.44–1.00)
Creatinine, Ser: 0.8 mg/dL (ref 0.44–1.00)
Creatinine, Ser: 0.7 mg/dL (ref 0.44–1.00)
Creatinine, Ser: 0.7 mg/dL (ref 0.44–1.00)
Glucose, Bld: 129 mg/dL — ABNORMAL HIGH (ref 70–99)
Glucose, Bld: 136 mg/dL — ABNORMAL HIGH (ref 70–99)
Glucose, Bld: 130 mg/dL — ABNORMAL HIGH (ref 70–99)
Glucose, Bld: 123 mg/dL — ABNORMAL HIGH (ref 70–99)
HCT: 27 % — ABNORMAL LOW (ref 36.0–46.0)
HCT: 27 % — ABNORMAL LOW (ref 36.0–46.0)
HCT: 27 % — ABNORMAL LOW (ref 36.0–46.0)
HCT: 27 % — ABNORMAL LOW (ref 36.0–46.0)
Hemoglobin: 9.2 g/dL — ABNORMAL LOW (ref 12.0–15.0)
Hemoglobin: 9.2 g/dL — ABNORMAL LOW (ref 12.0–15.0)
Hemoglobin: 9.2 g/dL — ABNORMAL LOW (ref 12.0–15.0)
Hemoglobin: 9.2 g/dL — ABNORMAL LOW (ref 12.0–15.0)
Potassium: 4.2 mmol/L (ref 3.5–5.1)
Potassium: 4.3 mmol/L (ref 3.5–5.1)
Potassium: 4.2 mmol/L (ref 3.5–5.1)
Potassium: 4.2 mmol/L (ref 3.5–5.1)
Sodium: 140 mmol/L (ref 135–145)
Sodium: 141 mmol/L (ref 135–145)
Sodium: 140 mmol/L (ref 135–145)
Sodium: 140 mmol/L (ref 135–145)
TCO2: 27 mmol/L (ref 22–32)
TCO2: 27 mmol/L (ref 22–32)
TCO2: 25 mmol/L (ref 22–32)
TCO2: 26 mmol/L (ref 22–32)

## 2021-09-16 LAB — POCT ACTIVATED CLOTTING TIME
Activated Clotting Time: 132 seconds
Activated Clotting Time: 225 seconds
Activated Clotting Time: 236 seconds
Activated Clotting Time: 115 seconds

## 2021-09-16 LAB — ABO/RH: ABO/RH(D): A POS

## 2021-09-16 SURGERY — IMPLANTATION, AORTIC VALVE, TRANSCATHETER, FEMORAL APPROACH
Anesthesia: Monitor Anesthesia Care

## 2021-09-16 MED ORDER — PANTOPRAZOLE SODIUM 40 MG PO TBEC
40.0000 mg | DELAYED_RELEASE_TABLET | Freq: Every day | ORAL | Status: DC
  Administered 2021-09-17: 40 mg via ORAL
  Filled 2021-09-16: qty 1

## 2021-09-16 MED ORDER — LEVOTHYROXINE SODIUM 100 MCG PO TABS
100.0000 ug | ORAL_TABLET | Freq: Every day | ORAL | Status: DC
  Administered 2021-09-17: 100 ug via ORAL
  Filled 2021-09-16: qty 1

## 2021-09-16 MED ORDER — IOHEXOL 350 MG/ML SOLN
INTRAVENOUS | Status: DC | PRN
  Administered 2021-09-16: 90 mL

## 2021-09-16 MED ORDER — ACETAMINOPHEN 325 MG PO TABS
650.0000 mg | ORAL_TABLET | Freq: Four times a day (QID) | ORAL | Status: DC | PRN
  Administered 2021-09-16 – 2021-09-17 (×2): 650 mg via ORAL
  Filled 2021-09-16 (×2): qty 2

## 2021-09-16 MED ORDER — LACTATED RINGERS IV SOLN: INTRAVENOUS | Status: DC | PRN

## 2021-09-16 MED ORDER — CHLORHEXIDINE GLUCONATE 4 % EX LIQD
60.0000 mL | Freq: Once | CUTANEOUS | Status: DC
  Filled 2021-09-16: qty 60

## 2021-09-16 MED ORDER — SODIUM CHLORIDE 0.9 % IV SOLN: 250.0000 mL | INTRAVENOUS | Status: DC | PRN

## 2021-09-16 MED ORDER — ACETAMINOPHEN 500 MG PO TABS
ORAL_TABLET | ORAL | Status: AC
  Administered 2021-09-16: 1000 mg via ORAL
  Filled 2021-09-16: qty 2

## 2021-09-16 MED ORDER — ACETAMINOPHEN 500 MG PO TABS: 1000.0000 mg | ORAL_TABLET | Freq: Once | ORAL | Status: AC

## 2021-09-16 MED ORDER — HEPARIN SODIUM (PORCINE) 1000 UNIT/ML IJ SOLN
INTRAMUSCULAR | Status: DC | PRN
  Administered 2021-09-16: 2000 [IU] via INTRAVENOUS
  Administered 2021-09-16: 14000 [IU] via INTRAVENOUS

## 2021-09-16 MED ORDER — LACTATED RINGERS IV SOLN: INTRAVENOUS | Status: DC

## 2021-09-16 MED ORDER — PHENYLEPHRINE 40 MCG/ML (10ML) SYRINGE FOR IV PUSH (FOR BLOOD PRESSURE SUPPORT)
PREFILLED_SYRINGE | INTRAVENOUS | Status: DC | PRN
  Administered 2021-09-16: 80 ug via INTRAVENOUS

## 2021-09-16 MED ORDER — SODIUM CHLORIDE 0.9 % IV SOLN: INTRAVENOUS | Status: DC

## 2021-09-16 MED ORDER — NITROGLYCERIN IN D5W 200-5 MCG/ML-% IV SOLN: 0.0000 ug/min | INTRAVENOUS | Status: DC

## 2021-09-16 MED ORDER — SODIUM CHLORIDE 0.9 % IV SOLN: 250.0000 mL | INTRAVENOUS | Status: DC

## 2021-09-16 MED ORDER — MORPHINE SULFATE (PF) 2 MG/ML IV SOLN
1.0000 mg | INTRAVENOUS | Status: DC | PRN
  Administered 2021-09-17: 2 mg via INTRAVENOUS
  Filled 2021-09-16: qty 1

## 2021-09-16 MED ORDER — CEFAZOLIN SODIUM-DEXTROSE 2-4 GM/100ML-% IV SOLN
2.0000 g | Freq: Three times a day (TID) | INTRAVENOUS | Status: AC
  Administered 2021-09-16 – 2021-09-17 (×2): 2 g via INTRAVENOUS
  Filled 2021-09-16 (×2): qty 100

## 2021-09-16 MED ORDER — PROTAMINE SULFATE 10 MG/ML IV SOLN
INTRAVENOUS | Status: DC | PRN
  Administered 2021-09-16: 40 mg via INTRAVENOUS
  Administered 2021-09-16: 50 mg via INTRAVENOUS
  Administered 2021-09-16: 10 mg via INTRAVENOUS
  Administered 2021-09-16: 50 mg via INTRAVENOUS

## 2021-09-16 MED ORDER — OXYCODONE HCL 5 MG PO TABS: 5.0000 mg | ORAL_TABLET | ORAL | Status: DC | PRN

## 2021-09-16 MED ORDER — CHLORHEXIDINE GLUCONATE 0.12 % MT SOLN
15.0000 mL | Freq: Once | OROMUCOSAL | Status: AC
  Filled 2021-09-16: qty 15

## 2021-09-16 MED ORDER — ASPIRIN 81 MG PO CHEW
81.0000 mg | CHEWABLE_TABLET | Freq: Every day | ORAL | Status: DC
  Administered 2021-09-17: 81 mg via ORAL
  Filled 2021-09-16: qty 1

## 2021-09-16 MED ORDER — SODIUM CHLORIDE 0.9% FLUSH
3.0000 mL | Freq: Two times a day (BID) | INTRAVENOUS | Status: DC
  Administered 2021-09-16 – 2021-09-17 (×2): 3 mL via INTRAVENOUS

## 2021-09-16 MED ORDER — SODIUM CHLORIDE 0.9 % IV SOLN: INTRAVENOUS | Status: AC

## 2021-09-16 MED ORDER — ONDANSETRON HCL 4 MG/2ML IJ SOLN: 4.0000 mg | Freq: Four times a day (QID) | INTRAMUSCULAR | Status: DC | PRN

## 2021-09-16 MED ORDER — SODIUM CHLORIDE 0.9% FLUSH: 3.0000 mL | INTRAVENOUS | Status: DC | PRN

## 2021-09-16 MED ORDER — TRAMADOL HCL 50 MG PO TABS: 50.0000 mg | ORAL_TABLET | ORAL | Status: DC | PRN

## 2021-09-16 MED ORDER — PROPOFOL 10 MG/ML IV BOLUS
INTRAVENOUS | Status: DC | PRN
  Administered 2021-09-16: 10 mg via INTRAVENOUS

## 2021-09-16 MED ORDER — LIDOCAINE HCL (PF) 1 % IJ SOLN
INTRAMUSCULAR | Status: DC | PRN
  Administered 2021-09-16 (×2): 10 mL

## 2021-09-16 MED ORDER — CHLORHEXIDINE GLUCONATE 4 % EX LIQD
30.0000 mL | CUTANEOUS | Status: DC
  Filled 2021-09-16: qty 30

## 2021-09-16 MED ORDER — ACETAMINOPHEN 650 MG RE SUPP: 650.0000 mg | Freq: Four times a day (QID) | RECTAL | Status: DC | PRN

## 2021-09-16 MED ORDER — HEPARIN (PORCINE) IN NACL 1000-0.9 UT/500ML-% IV SOLN
INTRAVENOUS | Status: DC | PRN
  Administered 2021-09-16 (×2): 500 mL

## 2021-09-16 MED ORDER — CHLORHEXIDINE GLUCONATE 0.12 % MT SOLN
OROMUCOSAL | Status: AC
  Administered 2021-09-16: 15 mL via OROMUCOSAL
  Filled 2021-09-16: qty 15

## 2021-09-16 SURGICAL SUPPLY — 29 items
BAG SNAP BAND KOVER 36X36 (MISCELLANEOUS) ×12 IMPLANT
BLANKET WARM UNDERBOD FULL ACC (MISCELLANEOUS) ×3 IMPLANT
CABLE ADAPT PACING TEMP 12FT (ADAPTER) ×3 IMPLANT
CATH 26 ULTRA DELIVERY (CATHETERS) ×3 IMPLANT
CATH DIAG 6FR PIGTAIL ANGLED (CATHETERS) ×6 IMPLANT
CATH INFINITI 6F AL1 (CATHETERS) ×3 IMPLANT
CATH S G BIP PACING (CATHETERS) ×3 IMPLANT
CLOSURE MYNX CONTROL 6F/7F (Vascular Products) ×3 IMPLANT
CLOSURE PERCLOSE PROSTYLE (VASCULAR PRODUCTS) ×6 IMPLANT
CRIMPER (MISCELLANEOUS) ×3 IMPLANT
DEVICE INFLATION ATRION QL2530 (MISCELLANEOUS) ×3 IMPLANT
GUIDEWIRE SAFE TJ AMPLATZ EXST (WIRE) ×3 IMPLANT
KIT HEART LEFT (KITS) ×3 IMPLANT
KIT SAPIAN 3 ULTRA RESILIA 26 (Valve) ×3 IMPLANT
PACK CARDIAC CATHETERIZATION (CUSTOM PROCEDURE TRAY) ×3 IMPLANT
SHEATH BRITE TIP 7FR 35CM (SHEATH) ×3 IMPLANT
SHEATH INTRODUCER SET 20-26 (SHEATH) ×3 IMPLANT
SHEATH PINNACLE 6F 10CM (SHEATH) ×3 IMPLANT
SHEATH PINNACLE 8F 10CM (SHEATH) ×3 IMPLANT
SLEEVE REPOSITIONING LENGTH 30 (MISCELLANEOUS) ×3 IMPLANT
STOPCOCK MORSE 400PSI 3WAY (MISCELLANEOUS) ×6 IMPLANT
TRANSDUCER W/STOPCOCK (MISCELLANEOUS) ×6 IMPLANT
TUBING CIL FLEX 10 FLL-RA (TUBING) ×3 IMPLANT
TUBING CONTRAST HIGH PRESS 48 (TUBING) ×3 IMPLANT
WIRE AMPLATZ SS-J .035X180CM (WIRE) ×3 IMPLANT
WIRE EMERALD 3MM-J .035X150CM (WIRE) ×3 IMPLANT
WIRE EMERALD 3MM-J .035X260CM (WIRE) ×3 IMPLANT
WIRE EMERALD ST .035X260CM (WIRE) ×3 IMPLANT
WIRE MICRO SET SILHO 5FR 7 (SHEATH) ×3 IMPLANT

## 2021-09-16 NOTE — H&P (Signed)
[address]       [place] [address]             [phone]      PCP is Sherrill Raring, MD Referring Provider is Verne Carrow, MD Primary Cardiologist is R. Hanley Hays, MD and Nanetta Batty, MD   Reason for admission:  Severe aortic stenosis   HPI:   The patient is a 80 year old woman with a history of hypertension, hypothyroidism, peripheral neuropathy, and severe aortic stenosis that has been followed by Dr. Hanley Hays.  She had a 2D echocardiogram at [facility] on 03/31/2021 showing a mean gradient of 30 mmHg with a peak gradient of 53 mmHg.  Aortic valve area was 0.6 cm.  Left ventricular ejection fraction was 60 to 65%.  She was referred to Dr. Allyson Sabal for cardiac catheterization due to progressive exertional fatigue and shortness of breath.  Cardiac catheterization on 06/09/2021 showed normal coronary arteries.  The peak to peak gradient across aortic valve was 22.3 mmHg with a valve area by thermo- dilution of 1.27 cm and by Fick 1.35 cm.  LVEDP was 11.  PA pressure was 42/6 with a mean of 23.  Mean right atrial pressure was 3.  Mean pulmonary wedge pressure was 11.  She had a repeat 2D echocardiogram on 07/25/2021 showing a mean gradient of 27.7 mmHg and a peak gradient of 46.5 mmHg.  Aortic valve area by VTI was 0.87 cm.  Stroke-volume index was 34 with a dimensionless index of 0.28.  Left ventricular ejection fraction was 45 to 50% with global hypokinesis and grade 1 diastolic dysfunction.   She reports progressive exertional fatigue and shortness of breath that is occurring with walking short distances.  She has had some exertional chest pressure and dizziness.  She has some edema in her ankles and feet.  She has had orthopnea.  She is widowed and retired.  She lives in an apartment in [facility].   She has partial dentures on the top and bottom but reported some broken teeth.  She was seen by Dr. Chales Salmon on 08/04/2021 and  extraction of 2 teeth was recommended.  She was referred to an oral surgeon for extraction.  This was done on 08/13/2021.       Past Medical History:  Diagnosis Date   Hypertension     Hypothyroid     Severe aortic stenosis             Past Surgical History:  Procedure Laterality Date   ABDOMINAL HYSTERECTOMY       CHOLECYSTECTOMY       RIGHT/LEFT HEART CATH AND CORONARY ANGIOGRAPHY N/A 06/09/2021    Procedure: RIGHT/LEFT HEART CATH AND CORONARY ANGIOGRAPHY;  Surgeon: Runell Gess, MD;  Location: [facility];  Service: Cardiovascular;  Laterality: N/A;           Family History  Problem Relation Age of Onset   Cancer Father     Diabetes Brother        Social History         Socioeconomic History   Marital status: Widowed      Spouse name: Not on file   Number of children: 6   Years of education: Not on file   Highest education level: Not on file  Occupational History   Occupation: Futures trader, drove a school bus, Engineer, petroleum  Tobacco Use   Smoking status: Never   Smokeless tobacco: Never  Vaping Use   Vaping Use: Never used  Substance and Sexual Activity   Alcohol use: Never   Drug use: Never   Sexual activity: Not on file  Other Topics Concern   Not on file  Social History Narrative   Not on file    Social Determinants of Health    Financial Resource Strain: Not on file  Food Insecurity: Not on file  Transportation Needs: Not on file  Physical Activity: Not on file  Stress: Not on file  Social Connections: Not on file  Intimate Partner Violence: Not on file             Prior to Admission medications   Medication Sig Start Date End Date Taking? Authorizing Provider  Ascorbic Acid (VITAMIN C PO) Take 1 capsule by mouth daily.     Yes [provider]  calcium carbonate (OS-CAL) 1250 (500 Ca) MG chewable tablet Chew 1 tablet by mouth daily.     Yes [provider]  Cholecalciferol (VITAMIN D3) 125 MCG (5000 UT) CAPS Take  5,000 Units by mouth daily.     Yes [provider]  estradiol (ESTRACE) 0.1 MG/GM vaginal cream Place 1 Applicatorful vaginally at bedtime.     Yes [provider]  fluticasone (FLONASE) 50 MCG/ACT nasal spray Place 1 spray into both nostrils daily.     Yes [provider]  gabapentin (NEURONTIN) 300 MG capsule Take 600 mg by mouth at bedtime. 10/28/18   Yes [provider]  levothyroxine (SYNTHROID) 100 MCG tablet Take 100 mcg by mouth daily before breakfast.     Yes [provider]  lisinopril (ZESTRIL) 10 MG tablet Take 10 mg by mouth daily.     Yes [provider]  loratadine (CLARITIN) 10 MG tablet Take 10 mg by mouth daily.     Yes [provider]  Magnesium 400 MG TABS Take 400 mg by mouth daily.     Yes [provider]  mirabegron ER (MYRBETRIQ) 50 MG TB24 tablet Take 50 mg by mouth daily.     Yes [provider]  Multiple Vitamins-Minerals (ZINC PO) Take 1 capsule by mouth daily.     Yes [provider]  niacin 500 MG tablet Take 500 mg by mouth daily.     Yes [provider]  nystatin cream (MYCOSTATIN) Apply 1 application topically 2 (two) times daily. 04/14/21   Yes [provider]  pantoprazole (PROTONIX) 40 MG tablet Take 40 mg by mouth daily. 08/26/18   Yes [provider]  polyvinyl alcohol (LIQUIFILM TEARS) 1.4 % ophthalmic solution Place 1 drop into both eyes daily as needed for dry eyes.     Yes [provider]  POTASSIUM PO Take 595 mg by mouth daily.     Yes [provider]  metoprolol tartrate (LOPRESSOR) 25 MG tablet Take 1 tablet (25 mg total) by mouth once for 1 dose. Take 2 hours prior to your CT scans. 07/29/21 07/29/21   Janetta Hora, PA-C            Current Outpatient Medications  Medication Sig Dispense Refill   Ascorbic Acid (VITAMIN C PO) Take 1 capsule by mouth daily.       calcium carbonate (OS-CAL) 1250 (500 Ca) MG chewable  tablet Chew 1 tablet by mouth daily.       Cholecalciferol (VITAMIN D3) 125 MCG (5000 UT) CAPS Take 5,000 Units by mouth daily.       estradiol (ESTRACE)  0.1 MG/GM vaginal cream Place 1 Applicatorful vaginally at bedtime.       fluticasone (FLONASE) 50 MCG/ACT nasal spray Place 1 spray into both nostrils daily.       gabapentin (NEURONTIN) 300 MG capsule Take 600 mg by mouth at bedtime.       levothyroxine (SYNTHROID) 100 MCG tablet Take 100 mcg by mouth daily before breakfast.       lisinopril (ZESTRIL) 10 MG tablet Take 10 mg by mouth daily.       loratadine (CLARITIN) 10 MG tablet Take 10 mg by mouth daily.       Magnesium 400 MG TABS Take 400 mg by mouth daily.       mirabegron ER (MYRBETRIQ) 50 MG TB24 tablet Take 50 mg by mouth daily.       Multiple Vitamins-Minerals (ZINC PO) Take 1 capsule by mouth daily.       niacin 500 MG tablet Take 500 mg by mouth daily.       nystatin cream (MYCOSTATIN) Apply 1 application topically 2 (two) times daily.       pantoprazole (PROTONIX) 40 MG tablet Take 40 mg by mouth daily.       polyvinyl alcohol (LIQUIFILM TEARS) 1.4 % ophthalmic solution Place 1 drop into both eyes daily as needed for dry eyes.       POTASSIUM PO Take 595 mg by mouth daily.       metoprolol tartrate (LOPRESSOR) 25 MG tablet Take 1 tablet (25 mg total) by mouth once for 1 dose. Take 2 hours prior to your CT scans. 1 tablet 0    No current facility-administered medications for this visit.      No Known Allergies       Review of Systems:               General:                      normal appetite, +decreased energy, no weight gain, no weight loss, no fever             Cardiac:                       + chest pain with exertion, + chest pain at rest, +SOB with mild exertion, + resting SOB, no PND, + orthopnea, + palpitations, no arrhythmia, no atrial fibrillation, + LE edema, + dizzy spells, no syncope             Respiratory:                 + shortness of breath, no home  oxygen, no productive cough, no dry cough, no bronchitis, + wheezing, no hemoptysis, no asthma, no pain with inspiration or cough, + sleep apnea, + CPAP at night             GI:                               no difficulty swallowing, no reflux, no frequent heartburn, no hiatal hernia, no abdominal pain, no constipation, no diarrhea, no hematochezia, no hematemesis, no melena             GU:                              no dysuria,  + frequency,  no urinary tract infection, no hematuria, no enlarged prostate, no kidney stones, no kidney disease             Vascular:                     no pain suggestive of claudication, no pain in feet, no leg cramps, no varicose veins, no DVT, no non-healing foot ulcer             Neuro:                         no stroke, no TIA's, no seizures, no headaches, no temporary blindness one eye,  no slurred speech, no peripheral neuropathy, no chronic pain, no instability of gait, no memory/cognitive dysfunction             Musculoskeletal:         no arthritis, no joint swelling, no myalgias, no difficulty walking, normal mobility              Skin:                            no rash, no itching, no skin infections, no pressure sores or ulcerations             Psych:                         no anxiety, no depression, no nervousness, no unusual recent stress             Eyes:                           no blurry vision, no floaters, no recent vision changes, no glasses or contacts             ENT:                            no hearing loss, no loose or painful teeth, + partial dentures, last saw dentist 08/13/21.             Hematologic:               no easy bruising, no abnormal bleeding, no clotting disorder, no frequent epistaxis             Endocrine:                   no diabetes, does not check CBG's at home                            Physical Exam:               BP 127/86 (BP Location: Left Arm, Patient Position: Sitting)   Pulse 91   Resp 20   Ht 5\' 2"  (1.575 m)    Wt 215 lb (97.5 kg)   SpO2 97% Comment: RA  BMI 39.32 kg/m              General:                      well-appearing             HEENT:  Unremarkable, NCAT, PERLA, EOMI             Neck:                           no JVD, no bruits, no adenopathy              Chest:                          clear to auscultation, symmetrical breath sounds, no wheezes, no rhonchi              CV:                              RRR, 3/6 systolic murmur RSB, No diastolic murmur             Abdomen:                    soft, non-tender, no masses              Extremities:                 warm, well-perfused, pulses palpable at ankle, + mild lower extremity edema in ankles             Rectal/GU                   Deferred             Neuro:                         Grossly non-focal and symmetrical throughout             Skin:                            Clean and dry, no rashes, no breakdown   Diagnostic Tests:   ECHOCARDIOGRAM REPORT         Patient Name:   Ronn Melena Date of Exam: 07/25/2021  Medical Rec #:  [id number]      Height:       62.0 in  Accession #:    [id number]     Weight:       211.0 lb  Date of Birth:  18-Jan-1941       BSA:          1.956 m  Patient Age:    36 years       BP:           134/74 mmHg  Patient Gender: F              HR:           86 bpm.  Exam Location:  [address]   Procedure: 2D Echo, Strain Analysis, Cardiac Doppler and Color Doppler   Indications:    I35.0 AS     History:        Patient has prior history of Echocardiogram examinations,  most                  recent 03/31/2021. AS; Risk Factors:Obesity.     Sonographer:    Samule Ohm RDCS  Referring Phys: [id number] MCALHANY   IMPRESSIONS     1. Left ventricular ejection fraction, by estimation, is 45 to  50%. The  left ventricle has mildly decreased function. The left ventricle  demonstrates global hypokinesis. Left ventricular diastolic parameters are  consistent with Grade I  diastolic  dysfunction (impaired relaxation). The average left ventricular global  longitudinal strain is -14.3 %. The global longitudinal strain is  abnormal.   2. Right ventricular systolic function is normal. The right ventricular  size is normal. There is mildly elevated pulmonary artery systolic  pressure. The estimated right ventricular systolic pressure is 42.9 mmHg.   3. Left atrial size was mildly dilated.   4. The mitral valve is degenerative. Mild mitral valve regurgitation. No  evidence of mitral stenosis.   5. Tricuspid valve regurgitation is moderate.   6. The aortic valve is calcified. There is moderate calcification of the  aortic valve. There is moderate thickening of the aortic valve. Aortic  valve regurgitation is not visualized. Moderate aortic valve stenosis.  Aortic valve area, by VTI measures  0.87 cm. Aortic valve mean gradient measures 27.7 mmHg. Aortic valve Vmax  measures 3.41 m/s.   7. The inferior vena cava is normal in size with greater than 50%  respiratory variability, suggesting right atrial pressure of 3 mmHg.   FINDINGS   Left Ventricle: Left ventricular ejection fraction, by estimation, is 45  to 50%. The left ventricle has mildly decreased function. The left  ventricle demonstrates global hypokinesis. The average left ventricular  global longitudinal strain is -14.3 %.  The global longitudinal strain is abnormal. The left ventricular internal  cavity size was normal in size. There is no left ventricular hypertrophy.  Left ventricular diastolic parameters are consistent with Grade I  diastolic dysfunction (impaired  relaxation).   Right Ventricle: The right ventricular size is normal. No increase in  right ventricular wall thickness. Right ventricular systolic function is  normal. There is mildly elevated pulmonary artery systolic pressure. The  tricuspid regurgitant velocity is 3.16   m/s, and with an assumed right atrial pressure of 3 mmHg,  the estimated  right ventricular systolic pressure is 42.9 mmHg.   Left Atrium: Left atrial size was mildly dilated.   Right Atrium: Right atrial size was normal in size.   Pericardium: There is no evidence of pericardial effusion.   Mitral Valve: The mitral valve is degenerative in appearance. There is  moderate thickening of the mitral valve leaflet(s). There is moderate  calcification of the mitral valve leaflet(s). Mild mitral valve  regurgitation. No evidence of mitral valve  stenosis.   Tricuspid Valve: The tricuspid valve is normal in structure. Tricuspid  valve regurgitation is moderate . No evidence of tricuspid stenosis.   Aortic Valve: The aortic valve is calcified. There is moderate  calcification of the aortic valve. There is moderate thickening of the  aortic valve. Aortic valve regurgitation is not visualized. Moderate  aortic stenosis is present. Aortic valve mean  gradient measures 27.7 mmHg. Aortic valve peak gradient measures 46.5  mmHg. Aortic valve area, by VTI measures 0.87 cm.   Pulmonic Valve: The pulmonic valve was normal in structure. Pulmonic valve  regurgitation is mild. No evidence of pulmonic stenosis.   Aorta: The aortic root is normal in size and structure.   Venous: The inferior vena cava is normal in size with greater than 50%  respiratory variability, suggesting right atrial pressure of 3 mmHg.   IAS/Shunts: No atrial level shunt detected by color flow Doppler.      LEFT VENTRICLE  PLAX 2D  LVIDd:  4.60 cm  Diastology  LVIDs:         3.20 cm  LV e' medial:    5.55 cm/s  LV PW:         1.00 cm  LV E/e' medial:  23.4  LV IVS:        1.00 cm  LV e' lateral:   9.95 cm/s  LVOT diam:     2.00 cm  LV E/e' lateral: 13.1  LV SV:         67  LV SV Index:   34       2D Longitudinal Strain  LVOT Area:     3.14 cm 2D Strain GLS Avg:     -14.3 %      RIGHT VENTRICLE             IVC  RV S prime:     23.00 cm/s  IVC diam: 1.70 cm  TAPSE  (M-mode): 2.6 cm  RVSP:           42.9 mmHg   LEFT ATRIUM             Index       RIGHT ATRIUM           Index  LA diam:        5.00 cm 2.56 cm/m  RA Pressure: 3.00 mmHg  LA Vol (A2C):   66.0 ml 33.75 ml/m RA Area:     14.00 cm  LA Vol (A4C):   72.1 ml 36.87 ml/m RA Volume:   33.30 ml  17.03 ml/m  LA Biplane Vol: 72.9 ml 37.28 ml/m   AORTIC VALVE  AV Area (Vmax):    0.92 cm  AV Area (Vmean):   0.89 cm  AV Area (VTI):     0.87 cm  AV Vmax:           341.00 cm/s  AV Vmean:          246.000 cm/s  AV VTI:            0.770 m  AV Peak Grad:      46.5 mmHg  AV Mean Grad:      27.7 mmHg  LVOT Vmax:         100.00 cm/s  LVOT Vmean:        69.400 cm/s  LVOT VTI:          0.214 m  LVOT/AV VTI ratio: 0.28     AORTA  Ao Root diam: 3.00 cm  Ao Asc diam:  3.55 cm   MITRAL VALVE                TRICUSPID VALVE  MV Area (PHT): 4.57 cm     TR Peak grad:   39.9 mmHg  MV Decel Time: 166 msec     TR Vmax:        316.00 cm/s  MV E velocity: 130.00 cm/s  Estimated RAP:  3.00 mmHg  MV A velocity: 156.00 cm/s  RVSP:           42.9 mmHg  MV E/A ratio:  0.83                              SHUNTS                              Systemic VTI:  0.21  m                              Systemic Diam: 2.00 cm   Donato Schultz MD  Electronically signed by Donato Schultz MD  Signature Date/Time: 07/25/2021/2:23:18 PM         Final       Physicians   Panel Physicians Referring Physician Case Authorizing Physician  Runell Gess, MD (Primary)        Procedures   RIGHT/LEFT HEART CATH AND CORONARY ANGIOGRAPHY    Conclusion       LV end diastolic pressure is normal.   Hemodynamic findings consistent with aortic valve stenosis.   Maisey Deandrade is a 80 y.o. female      [id number] LOCATION:  FACILITY: [facility]  PHYSICIAN: Nanetta Batty, M.D. 02/27/1941     DATE OF PROCEDURE:  06/09/2021   DATE OF DISCHARGE:       CARDIAC CATHETERIZATION        History obtained from chart review.Nesreen Albano is a 80 y.o. moderately overweight widowed Caucasian female mother of 18, grandmother of 9 grandchildren who is accompanied by one of her daughters Herbert Seta today.  She was referred by Dr. Hanley Hays, her cardiologist, for right left heart cath because of symptomatic severe aortic stenosis.  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  She has no cardiac risk factors other than treated hypertension.  She never smoked.  Her brother did die of a myocardial infarction.  She is never had a heart attack or stroke.  She denies chest pain but has had progressive dyspnea.  She also has obstructive sleep apnea on CPAP.  She had a 2D echo performed by Dr. Hanley Hays revealing normal LV systolic function with severe aortic stenosis.  Valve area was 0.6 cm with a peak gradient of 57 mmHg.  A Myoview stress test was normal as well.  She has noticed increasing dyspnea on exertion when doing normal activity.     IMPRESSION: Ms. Nixon has normal coronary arteries and moderate aortic stenosis.  Her LVEDP was 11 and her peak to peak aortic valve gradient was 22.3 mmHg.  Her valve area by thermodilution was 1.27 cm with an index of 0.66 cm/m and by Fick was 1.35cm with an index of 0.7 cm/m.  The right common femoral venous sheath was removed and the next closure device successfully deployed achieving hemostasis.  The right radial artery sheath was removed and a TR band was placed on the wrist to achieve hemostasis.  The patient left lab in stable condition.  She will be gently hydrated for several hours and discharged home.  I will see her back in the office for a return office visit next week.  She may be a candidate for TAVR.   Nanetta Batty. MD, [facility] 06/09/2021 2:03 PM         Procedural Details   Technical Details PROCEDURE DESCRIPTION:   The patient was brought to the second floor  [facility] lab in the postabsorptive state.  She was  notpremedicated .  Her right wrist and groin Were prepped and shaved in usual sterile fashion. Xylocaine 1% was used for local anesthesia. A 6 French sheath was inserted into the right radial artery using standard Seldinger technique.  A 7 French sheath was inserted into the right common femoral vein.  The patient received 4500 units  of heparin intravenously. A 7 [place] balloontipped thermodilution Swan-Ganz catheter was then advanced through the right heart chambers obtaining sequential pressures and blood samples for the determination of Fick and thermal dilution cardiac outputs.  A 5 [place] TIG catheter and right Judkins catheter along with a straight wire were used for selective coronary angiography and obtain left heart pressures.  Isovue dye was used for the entirety of the case.  Retrograde aortic and left ventricular and pullback pressures were recorded.  Radial cocktail was administered via the SideArm sheath.  A MYNX  closure device was successfully deployed in the right common femoral vein at the end of the case achieving hemostasis. Estimated blood loss <50 mL.   During this procedure no sedation was administered.    Medications (Filter: Administrations occurring from 1230 to 1352 on 06/09/21)  important  Continuous medications are totaled by the amount administered until 06/09/21 1352.    Heparin (Porcine) in NaCl 1000-0.9 UT/500ML-% SOLN (mL) Total volume:  1,000 mL Date/Time Rate/Dose/Volume Action    06/09/21 1314 500 mL Given    1315 500 mL Given      lidocaine (PF) (XYLOCAINE) 1 % injection (mL) Total volume:  12 mL Date/Time Rate/Dose/Volume Action    06/09/21 1321 10 mL Given    1328 2 mL Given      Radial Cocktail (Verapamil 5 mg, NTG, Lidocaine) (mL) Total volume:  6 mL Date/Time Rate/Dose/Volume Action    06/09/21 1329 6 mL Given      heparin sodium (porcine) injection (Units) Total dose:  4,500 Units Date/Time Rate/Dose/Volume Action    06/09/21 1331 4,500 Units  Given      iohexol (OMNIPAQUE) 350 MG/ML injection (mL) Total volume:  35 mL Date/Time Rate/Dose/Volume Action    06/09/21 1349 35 mL Given      Radiation/Fluoro   Fluoro time: 4.5 (min) DAP: 96045 (mGycm2) Cumulative Air Kerma: 767 (mGy) Coronary Findings   Diagnostic Dominance: Right No diagnostic findings have been documented. Intervention   No interventions have been documented. Right Heart   Right Heart Pressures Hemodynamic findings consistent with aortic valve stenosis. LV EDP is normal.    Coronary Diagrams   Diagnostic Dominance: Right Intervention   Implants         Vascular Products   Closure Mynx Control 20f/58f - [id number] - Implanted Inventory item: CLOSURE [facility] CONTROL 38F/82F Model/Cat number: [id number]  Manufacturer: [employer] Lot number: [id number]  Device identifier: [id number] Device identifier type: GS1  GUDID Information   Request status Successful      Brand name: MYNX CONTROL Version/Model: GM0102  Company name: [employer] MRI safety info as of 06/09/21: MR Safe  Contains dry or latex rubber: No      GMDN P.T. name: Wound hydrogel dressing, non-antimicrobial        As of 06/09/2021   Status: Implanted          Syngo Images    Show images for CARDIAC CATHETERIZATION Images on Long Term Storage    Show images for Kaira, [place] to Procedure Log   Procedure Log    Hemo Data   Flowsheet Row Most Recent Value  Fick Cardiac Output 8.33 L/min  Fick Cardiac Output Index 4.35 (L/min)/BSA  Thermal Cardiac Output 7.85 L/min  Thermal Cardiac Output Index 4.1 (L/min)/BSA  Aortic Mean Gradient 27.83 mmHg  Aortic Peak Gradient 23 mmHg  Aortic Valve Area 1.27  Aortic Value Area Index 0.66 cm2/BSA  RA A Wave 6  mmHg  RA V Wave 5 mmHg  RA Mean 3 mmHg  RV Systolic Pressure 43 mmHg  RV Diastolic Pressure -2 mmHg  RV EDP 5 mmHg  PA Systolic Pressure 42 mmHg  PA Diastolic Pressure 6 mmHg  PA Mean 23 mmHg  PW A Wave  12 mmHg  PW V Wave 7 mmHg  PW Mean 11 mmHg  AO Systolic Pressure 136 mmHg  AO Diastolic Pressure 65 mmHg  AO Mean 96 mmHg  LV Systolic Pressure 151 mmHg  LV Diastolic Pressure -1 mmHg  LV EDP 0 mmHg  AOp Systolic Pressure 137 mmHg  AOp Diastolic Pressure 63 mmHg  AOp Mean Pressure 93 mmHg  LVp Systolic Pressure 155 mmHg  LVp Diastolic Pressure 0 mmHg  LVp EDP Pressure 11 mmHg  TPVR Index 5.61 HRUI  TSVR Index 23.41 HRUI  PVR SVR Ratio 0.13  TPVR/TSVR Ratio 0.24      ADDENDUM REPORT: 08/10/2021 12:40   CLINICAL DATA:  Pre-op transcatheter aortic valve replacement (TAVR)   EXAM: Cardiac TAVR CT   TECHNIQUE: The patient was scanned on a Siemens Force 192 slice scanner. A 120 kV retrospective scan was triggered in the descending thoracic aorta at 111 HU's. Gantry rotation speed was 270 msecs and collimation was .9 mm. The 3D data set was reconstructed in 5% intervals of the R-R cycle. Systolic and diastolic phases were analyzed on a dedicated work station using MPR, MIP and VRT modes. The patient received 30mL OMNIPAQUE IOHEXOL 350 MG/ML SOLN of contrast.   FINDINGS: Aortic Valve: 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 Moderately thickened, moderately calcified aortic valve cusps.   AV calcium score: 487   Virtual Basal Annulus Measurements:   Maximum/Minimum Diameter: 28 x 22.2 mm   Perimeter:  78 mm   Area: 467 mm2   No significant LVOT calcifications.   Based on these measurements, the annulus would be suitable for a 26 mm Sapien 3 valve.   Sinus of Valsalva Measurements:   Non-coronary: 29 mm   Right - coronary:  29 mm   Left - coronary:  30 mm   Sinus of Valsalva Height:   Left: 17.8 mm   Right: 19.0 mm   Aorta: Conventional 3 vessel branch pattern of aortic arch, moderate aortic atherosclerosis primarily in aortic arch.   Sinotubular Junction:  28 mm   Ascending Thoracic Aorta:  34 mm   Aortic Arch:  26 mm    Descending Thoracic Aorta:  25 mm   Coronary Artery Height above Annulus:   Left Main: 12.1 mm   Right Coronary: 13.1 mm   Coronary Arteries: Normal coronary origin. Right dominance. The study was performed without use of NTG and insufficient for plaque evaluation. Calcifications in the LAD and RCA.   Optimum Fluoroscopic Angle for Delivery: LAO 14, CAU 9   No LA appendage thrombus.   Normal variant pulmonary vein drainage, with a common antrum of the left sided pulmonary veins before draining into the left atrium.   Mild mitral annular calcifications.   IMPRESSION: 1. 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 Moderately thickened, moderately calcified aortic valve cusps.   2.  AV calcium score: 487   3. Annulus area: 467 mm2, No significant LVOT calcifications. The annulus would be suitable for a 26 mm Sapien 3 valve.   4.  Sufficient coronary artery heights from annulus for deployment.   5. Optimum Fluoroscopic Angle for Delivery: LAO 14, CAU 9     Electronically Signed  By: Weston Brass M.D.   On: 08/10/2021 12:40    Addended by Parke Poisson, MD on 08/10/2021 12:43 PM    Study Result   Narrative & Impression  EXAM: OVER-READ INTERPRETATION  CT CHEST   The following report is an over-read performed by radiologist Dr. Cleone Slim of [facility] on 08/06/2021. This over-read does not include interpretation of cardiac or coronary anatomy or pathology. The coronary calcium score/coronary CTA interpretation by the cardiologist is attached.   COMPARISON:  08/24/2018 chest radiograph.   FINDINGS: Please see the separate concurrent chest CT angiogram report for details.   IMPRESSION: Please see the separate concurrent chest CT angiogram report for details.   Electronically Signed: By: Delbert Phenix M.D. On: 08/07/2021 08:26        Narrative & Impression  CLINICAL DATA:  Severe aortic stenosis. TAVR  pre-intervention planning.   EXAM: CT ANGIOGRAPHY CHEST, ABDOMEN AND PELVIS   TECHNIQUE: Multidetector CT imaging through the chest, abdomen and pelvis was performed using the standard protocol during bolus administration of intravenous contrast. Multiplanar reconstructed images and MIPs were obtained and reviewed to evaluate the vascular anatomy.   CONTRAST:  95mL OMNIPAQUE IOHEXOL 350 MG/ML SOLN   COMPARISON:  09/09/2018 CT abdomen/pelvis.   FINDINGS: CTA CHEST FINDINGS   Cardiovascular: Mild cardiomegaly. No significant pericardial effusion/thickening. Left anterior descending coronary atherosclerosis. Diffuse thickening and calcification of the aortic valve. Atherosclerotic nonaneurysmal thoracic aorta. Top-normal caliber main pulmonary artery (3.4 cm diameter). No central pulmonary emboli.   Mediastinum/Nodes: No discrete thyroid nodules. Unremarkable esophagus. No pathologically enlarged axillary, mediastinal or hilar lymph nodes.   Lungs/Pleura: No pneumothorax. No pleural effusion. Two scattered tiny peripheral left upper lobe ground-glass nodules, largest 3 mm (series 5/image 58). No acute consolidative airspace disease or lung masses. Mild compressive atelectasis at the medial basilar left lower lobe due to the hiatal hernia.   Musculoskeletal: No aggressive appearing focal osseous lesions. Mild thoracic spondylosis.   CTA ABDOMEN AND PELVIS FINDINGS   Hepatobiliary: Normal liver with no liver mass. Cholecystectomy. Bile ducts are within normal post cholecystectomy limits with CBD diameter 9 mm.   Pancreas: Diffuse fatty atrophy, with no mass or duct dilation.   Spleen: Normal size. No mass.   Adrenals/Urinary Tract: Normal adrenals. No hydronephrosis. No contour deforming renal masses. Normal bladder.   Stomach/Bowel: Large hiatal hernia. Stomach is nondistended and otherwise normal. Moderate right inguinal hernia contains small bowel loops. No small  bowel dilatation, caliber transition, wall thickening or pneumatosis. Normal appendix. Marked diffuse colonic diverticulosis with no large bowel wall thickening or significant pericolonic fat stranding.   Vascular/Lymphatic: Mildly atherosclerotic nonaneurysmal abdominal aorta. No pathologically enlarged lymph nodes in the abdomen or pelvis.   Reproductive: Status post hysterectomy, with no abnormal findings at the vaginal cuff. Simple 1.7 cm right adnexal cyst (series 4/image 177). No left adnexal masses.   Other: No pneumoperitoneum, ascites or focal fluid collection.   Musculoskeletal: No aggressive appearing focal osseous lesions. Moderate degenerative disc disease at L5-S1.   VASCULAR MEASUREMENTS PERTINENT TO TAVR:   AORTA:   Minimal Aortic Diameter-17.7 x 17.1 mm   Severity of Aortic Calcification-mild-to-moderate   RIGHT PELVIS:   Right Common Iliac Artery -   Minimal Diameter-11.0 x 10.9 mm   Tortuosity-mild   Calcification-mild   Right External Iliac Artery -   Minimal Diameter-8.8 x 8.5 mm   Tortuosity-moderate   Calcification-none   Right Common Femoral Artery -   Minimal Diameter-9.4 x 9.3 mm  Tortuosity-mild   Calcification-mild   LEFT PELVIS:   Left Common Iliac Artery -   Minimal Diameter-11.0 x 9.6 mm   Tortuosity-mild   Calcification-moderate   Left External Iliac Artery -   Minimal Diameter-9.4 x 8.6 mm   Tortuosity-mild   Calcification-none   Left Common Femoral Artery -   Minimal Diameter-8.3 x 8.2 mm   Tortuosity-mild   Calcification-mild   Review of the MIP images confirms the above findings.   IMPRESSION: 1. Vascular findings and measurements pertinent to potential TAVR procedure, as detailed. 2. Diffuse thickening and calcification of the aortic valve, compatible with the reported history of severe aortic stenosis. 3. Mild cardiomegaly. One vessel coronary atherosclerosis. 4. Large hiatal hernia. 5.  Marked diffuse colonic diverticulosis. 6. Moderate right inguinal hernia contains small bowel loops. No evidence of acute bowel complication. 7. Simple 1.7 cm right adnexal cyst. No follow-up imaging is recommended. Reference: JACR 2020 Feb;17(2):248-254 8. Two scattered tiny peripheral left upper lobe ground-glass nodules, largest 3 mm. No follow-up required. 9. Aortic Atherosclerosis (ICD10-I70.0).     Electronically Signed   By: Delbert Phenix M.D.   On: 08/07/2021 08:50      STS Risk Score: Risk of Mortality: 1.859% Renal Failure: 1.599% Permanent Stroke: 1.052% Prolonged Ventilation: 5.725% DSW Infection: 0.119% Reoperation: 2.401% Morbidity or Mortality: 9.606% Short Length of Stay: 36.061% Long Length of Stay: 4.543%     Impression:   This 80 year old woman has stage D, severe, symptomatic aortic stenosis with New York Heart Association class III symptoms of progressive exertional fatigue and shortness of breath consistent with chronic diastolic congestive heart failure.  She has also been having substernal chest discomfort, dizziness, and lower extremity edema.  She feels that her symptoms are continuing to worsen and she has difficulty walking down the hall in her apartment building to visit friends.  I have personally reviewed her 2D echocardiogram, cardiac catheterization, and CTA studies.  Her echocardiogram shows a moderately calcified and thickened aortic valve with restricted leaflet mobility.  The mean gradient was 27.7 mmHg with a valve area by VTI of 0.87 cm consistent with severe low gradient aortic stenosis.  Her valve clearly looks severely stenotic on echocardiogram.  Cardiac catheterization shows normal coronary arteries.  The peak to peak gradient was measured at 22 mmHg.  I agree that aortic valve replacement is indicated in this patient for relief of her worsening symptoms and to prevent progressive left ventricular deterioration.  Her left ventricular  ejection fraction has decreased from 60 to 65% in May to 45 to 50% on her most recent echo.  Given her age I think transcatheter aortic valve replacement would be a reasonable option for treating her.  Her gated cardiac CTA shows anatomy suitable for TAVR using a 26 mm SAPIEN 3 valve.  Her abdominal and pelvic CTA shows adequate pelvic vascular anatomy to allow transfemoral insertion.   The patient and her daughter were counseled at length regarding treatment alternatives for management of severe symptomatic aortic stenosis. The risks and benefits of surgical intervention has been discussed in detail. Long-term prognosis with medical therapy was discussed. Alternative approaches such as conventional surgical aortic valve replacement, transcatheter aortic valve replacement, and palliative medical therapy were compared and contrasted at length. This discussion was placed in the context of the patient's own specific clinical presentation and past medical history. All of their questions have been addressed.    Following the decision to proceed with transcatheter aortic valve replacement, a discussion was held regarding  what types of management strategies would be attempted intraoperatively in the event of life-threatening complications, including whether or not the patient would be considered a candidate for the use of cardiopulmonary bypass and/or conversion to open sternotomy for attempted surgical intervention.  I think she would be a candidate for emergent sternotomy to manage any intraoperative complications.  The patient is aware of the fact that transient use of cardiopulmonary bypass may be necessary. The patient has been advised of a variety of complications that might develop including but not limited to risks of death, stroke, paravalvular leak, aortic dissection or other major vascular complications, aortic annulus rupture, device embolization, cardiac rupture or perforation, mitral regurgitation, acute  myocardial infarction, arrhythmia, heart block or bradycardia requiring permanent pacemaker placement, congestive heart failure, respiratory failure, renal failure, pneumonia, infection, other late complications related to structural valve deterioration or migration, or other complications that might ultimately cause a temporary or permanent loss of functional independence or other long term morbidity. The patient provides full informed consent for the procedure as described and all questions were answered.       Plan:   Transfemoral transcatheter aortic valve replacement using a SAPIEN 3 valve.   Alleen Borne, MD

## 2021-09-16 NOTE — Anesthesia Procedure Notes (Signed)
Procedure Name: MAC Date/Time: 09/16/2021 1:30 PM Performed by: Amadeo Garnet, CRNA Pre-anesthesia Checklist: Patient identified, Emergency Drugs available, Suction available and Patient being monitored Patient Re-evaluated:Patient Re-evaluated prior to induction Oxygen Delivery Method: Simple face mask Preoxygenation: Pre-oxygenation with 100% oxygen Induction Type: IV induction Placement Confirmation: positive ETCO2 Dental Injury: Teeth and Oropharynx as per pre-operative assessment

## 2021-09-16 NOTE — Progress Notes (Signed)
LOCATION: right RADIAL a-line  A-line removed by Alfredo Bach, RN, manual pressure applied for approximately 10 minutes, gauze with tegaderm applied  SITE UPON ARRIVAL: LEVEL 0  SITE AFTER BAND REMOVAL: LEVEL 0  CIRCULATION SENSATION AND MOVEMENT: Yes, +2 radial pulse

## 2021-09-16 NOTE — Progress Notes (Signed)
  [facility]   MULTIDISCIPLINARY HEART VALVE TEAM  Patient doing well s/p TAVR. She is hemodynamically stable. Groin sites stable. ECG with no high grade block however has underlying LBBB which was present prior to procedure. Arterial line to be discontinued and transferred to 4E.  Plan for early ambulation after bedrest completed and hopeful discharge over the next 24-48 hours.   Georgie Chard NP-C Structural Heart Team  Pager: [phone]

## 2021-09-16 NOTE — Transfer of Care (Signed)
Immediate Anesthesia Transfer of Care Note  Patient: Nicole Gates  Procedure(s) Performed: TRANSCATHETER AORTIC VALVE REPLACEMENT, TRANSFEMORAL TRANSESOPHAGEAL ECHOCARDIOGRAM (TEE)  Patient Location: Cath Lab  Anesthesia Type:MAC  Level of Consciousness: awake, alert  and oriented  Airway & Oxygen Therapy: Patient Spontanous Breathing and Patient connected to nasal cannula oxygen  Post-op Assessment: Report given to RN and Post -op Vital signs reviewed and stable  Post vital signs: Reviewed and stable  Last Vitals:  Vitals Value Taken Time  BP 101/48 09/16/21 1518  Temp 36.1 C 09/16/21 1512  Pulse 68 09/16/21 1518  Resp 15 09/16/21 1518  SpO2 93 % 09/16/21 1518  Vitals shown include unvalidated device data.  Last Pain:  Vitals:   09/16/21 1512  TempSrc: Tympanic  PainSc: Asleep      Patients Stated Pain Goal: 0 (41/63/84 [id number])  Complications: No notable events documented.

## 2021-09-16 NOTE — Progress Notes (Signed)
  Echocardiogram 2D Echocardiogram has been performed.  Nicole Gates 09/16/2021, 2:50 PM

## 2021-09-16 NOTE — Anesthesia Preprocedure Evaluation (Signed)
Anesthesia Evaluation  Patient identified by MRN, date of birth, ID band Patient awake    Reviewed: Allergy & Precautions, NPO status , Patient's Chart, lab work & pertinent test results  Airway Mallampati: II  TM Distance: >3 FB Neck ROM: Full    Dental  (+) Teeth Intact, Dental Advisory Given   Pulmonary sleep apnea ,    Pulmonary exam normal breath sounds clear to auscultation       Cardiovascular hypertension, Pt. on medications + Valvular Problems/Murmurs AS  Rhythm:Regular Rate:Normal + Systolic murmurs    Neuro/Psych negative neurological ROS  negative psych ROS   GI/Hepatic Neg liver ROS, GERD  Medicated,  Endo/Other  Hypothyroidism Obesity   Renal/GU negative Renal ROS     Musculoskeletal negative musculoskeletal ROS (+)   Abdominal   Peds  Hematology  (+) Blood dyscrasia, anemia ,   Anesthesia Other Findings Day of surgery medications reviewed with the patient.  Reproductive/Obstetrics                             Anesthesia Physical Anesthesia Plan  ASA: 4  Anesthesia Plan: MAC   Post-op Pain Management:    Induction: Intravenous  PONV Risk Score and Plan: 2 and Treatment may vary due to age or medical condition, Dexamethasone and Ondansetron  Airway Management Planned: Natural Airway and Simple Face Mask  Additional Equipment:   Intra-op Plan:   Post-operative Plan:   Informed Consent:     Dental advisory given  Plan Discussed with: CRNA  Anesthesia Plan Comments:         Anesthesia Quick Evaluation

## 2021-09-16 NOTE — Progress Notes (Signed)
Patient to room 4E02. Vital signs obtained. On monitor CCMD notified. Bilateral groins level 0. Clean, dry, and intact. Patient alert and oriented to room and call light. Call bell within reach. Will continue to monitor.  Sabra Heck, RN

## 2021-09-16 NOTE — Op Note (Signed)
[facility]   MULTIDISCIPLINARY HEART VALVE TEAM   TAVR OPERATIVE NOTE   Date of Procedure:  09/16/2021  Preoperative Diagnosis: Severe Aortic Stenosis   Postoperative Diagnosis: Same   Procedure:   Transcatheter Aortic Valve Replacement - Percutaneous Right Transfemoral Approach  Edwards Sapien 3 Ultra RSL THV (size 26 mm, model # 9755RSL, serial # [id number])   Co-Surgeons:  Alleen Borne, MD and Verne Carrow, MD proctoring physician) and Alverda Skeans, MD   Anesthesiologist:  Arrie Aran, MD  Echocardiographer:  Guinevere Scarlet, MD  Pre-operative Echo Findings: Moderate to severe aortic stenosis Mild left ventricular systolic dysfunction  Post-operative Echo Findings: No paravalvular leak Unchanged mild left ventricular systolic dysfunction   BRIEF CLINICAL NOTE AND INDICATIONS FOR SURGERY    This 80 year old woman has stage D, severe, symptomatic aortic stenosis with New York Heart Association class III symptoms of progressive exertional fatigue and shortness of breath consistent with chronic diastolic congestive heart failure.  She has also been having substernal chest discomfort, dizziness, and lower extremity edema.  She feels that her symptoms are continuing to worsen and she has difficulty walking down the hall in her apartment building to visit friends.  I have personally reviewed her 2D echocardiogram, cardiac catheterization, and CTA studies.  Her echocardiogram shows a moderately calcified and thickened aortic valve with restricted leaflet mobility.  The mean gradient was 27.7 mmHg with a valve area by VTI of 0.87 cm consistent with severe low gradient aortic stenosis.  Her valve clearly looks severely stenotic on echocardiogram.  Cardiac catheterization shows normal coronary arteries.  The peak to peak gradient was measured at 22 mmHg.  I agree that aortic valve replacement is indicated in this patient for relief of her worsening symptoms and to  prevent progressive left ventricular deterioration.  Her left ventricular ejection fraction has decreased from 60 to 65% in May to 45 to 50% on her most recent echo.  Given her age I think transcatheter aortic valve replacement would be a reasonable option for treating her.  Her gated cardiac CTA shows anatomy suitable for TAVR using a 26 mm SAPIEN 3 valve.  Her abdominal and pelvic CTA shows adequate pelvic vascular anatomy to allow transfemoral insertion.   The patient and her daughter were counseled at length regarding treatment alternatives for management of severe symptomatic aortic stenosis. The risks and benefits of surgical intervention has been discussed in detail. Long-term prognosis with medical therapy was discussed. Alternative approaches such as conventional surgical aortic valve replacement, transcatheter aortic valve replacement, and palliative medical therapy were compared and contrasted at length. This discussion was placed in the context of the patient's own specific clinical presentation and past medical history. All of their questions have been addressed.    Following the decision to proceed with transcatheter aortic valve replacement, a discussion was held regarding what types of management strategies would be attempted intraoperatively in the event of life-threatening complications, including whether or not the patient would be considered a candidate for the use of cardiopulmonary bypass and/or conversion to open sternotomy for attempted surgical intervention.  I think she would be a candidate for emergent sternotomy to manage any intraoperative complications.  The patient is aware of the fact that transient use of cardiopulmonary bypass may be necessary. The patient has been advised of a variety of complications that might develop including but not limited to risks of death, stroke, paravalvular leak, aortic dissection or other major vascular complications, aortic annulus rupture, device  embolization, cardiac  rupture or perforation, mitral regurgitation, acute myocardial infarction, arrhythmia, heart block or bradycardia requiring permanent pacemaker placement, congestive heart failure, respiratory failure, renal failure, pneumonia, infection, other late complications related to structural valve deterioration or migration, or other complications that might ultimately cause a temporary or permanent loss of functional independence or other long term morbidity. The patient provides full informed consent for the procedure as described and all questions were answered.     DETAILS OF THE OPERATIVE PROCEDURE  PREPARATION:    The patient was brought to the operating room on the above mentioned date and appropriate monitoring was established by the anesthesia team. The patient was placed in the supine position on the operating table.  Intravenous antibiotics were administered. The patient was monitored closely throughout the procedure under conscious sedation.   Baseline transthoracic echocardiogram was performed. The patient's abdomen and both groins were prepped and draped in a sterile manner. A time out procedure was performed.   PERIPHERAL ACCESS:    Using the modified Seldinger technique, femoral arterial and venous access was obtained with placement of 6 Fr sheaths on the left side.  A pigtail diagnostic catheter was passed through the left arterial sheath under fluoroscopic guidance into the aortic root.  A temporary transvenous pacemaker catheter was passed through the left femoral venous sheath under fluoroscopic guidance into the right ventricle.  The pacemaker was tested to ensure stable lead placement and pacemaker capture. Aortic root angiography was performed in order to determine the optimal angiographic angle for valve deployment.   TRANSFEMORAL ACCESS:   Percutaneous transfemoral access and sheath placement was performed using ultrasound guidance.  The right common  femoral artery was cannulated using a micropuncture needle and appropriate location was verified using hand injection angiogram.  A pair of Abbott Perclose percutaneous closure devices were placed and a 6 French sheath replaced into the femoral artery.  The patient was heparinized systemically and ACT verified > 250 seconds.    A 14 Fr transfemoral E-sheath was introduced into the right common femoral artery after progressively dilating over an Amplatz superstiff wire. An AL-1 catheter was used to direct a straight-tip exchange length wire across the native aortic valve into the left ventricle. This was exchanged out for a pigtail catheter and position was confirmed in the LV apex. Simultaneous LV and Ao pressures were recorded.  The pigtail catheter was exchanged for an Amplatz Extra-stiff wire in the LV apex.    BALLOON AORTIC VALVULOPLASTY:   Not performed   TRANSCATHETER HEART VALVE DEPLOYMENT:   An Edwards Sapien 3 Ultra RSL transcatheter heart valve (size 26 mm) was prepared and crimped per manufacturer's guidelines, and the proper orientation of the valve is confirmed on the [employer] delivery system. The valve was advanced through the introducer sheath using normal technique until in an appropriate position in the abdominal aorta beyond the sheath tip. The balloon was then retracted and using the fine-tuning wheel was centered on the valve. The valve was then advanced across the aortic arch using appropriate flexion of the catheter. The valve was carefully positioned across the aortic valve annulus. The Commander catheter was retracted using normal technique. Once final position of the valve has been confirmed by angiographic assessment, the valve is deployed while temporarily holding ventilation and during rapid ventricular pacing to maintain systolic blood pressure < 50 mmHg and pulse pressure < 10 mmHg. The balloon inflation is held for >3 seconds after reaching full deployment volume.  Once the balloon has fully deflated the  balloon is retracted into the ascending aorta and valve function is assessed using echocardiography. There is felt to be no paravalvular leak and no central aortic insufficiency.  The patient's hemodynamic recovery following valve deployment is good.  The deployment balloon and guidewire are both removed.    PROCEDURE COMPLETION:   The sheath was removed and femoral artery closure performed.  Protamine was administered once femoral arterial repair was complete. The temporary pacemaker, pigtail catheters and femoral sheaths were removed with manual pressure used for hemostasis.  A Mynx femoral closure device was utilized following removal of the diagnostic sheath in the left femoral artery.  The patient tolerated the procedure well and is transported to the cath lab recovery area in stable condition. There were no immediate intraoperative complications. All sponge instrument and needle counts are verified correct at completion of the operation.   No blood products were administered during the operation.  The patient received a total of 90 mL of intravenous contrast during the procedure.   Alleen Borne, MD 09/16/2021

## 2021-09-16 NOTE — Op Note (Signed)
[facility]   MULTIDISCIPLINARY HEART VALVE TEAM   TAVR OPERATIVE NOTE   Date of Procedure:  09/16/2021  Preoperative Diagnosis: Severe Aortic Stenosis   Postoperative Diagnosis: Same   Procedure:   Transcatheter Aortic Valve Replacement - Percutaneous Right Transfemoral Approach  Edwards Sapien 3 Ultra THV (size 26 mm, model # 9755RSL, serial # [id number])   Co-Surgeons:  Alleen Borne, MD, Verne Carrow, MD (proctoring physician), and Alverda Skeans, MD  Anesthesiologist:  Arrie Aran, MD  Echocardiographer:  Lennie Odor, MD  Pre-operative Echo Findings: Moderate to severe aortic stenosis Mild left ventricular systolic function  Post-operative Echo Findings: No paravalvular leak Unchanged left ventricular systolic function  BRIEF CLINICAL NOTE AND INDICATIONS FOR SURGERY  The patient is a 80 year old woman with a history of hypertension, hypothyroidism, peripheral neuropathy, and severe aortic stenosis that has been followed by Dr. Hanley Hays.  She had a 2D echocardiogram at [facility] on 03/31/2021 showing a mean gradient of 30 mmHg with a peak gradient of 53 mmHg.  Aortic valve area was 0.6 cm.  Left ventricular ejection fraction was 60 to 65%.  She was referred to Dr. Allyson Sabal for cardiac catheterization due to progressive exertional fatigue and shortness of breath.  Cardiac catheterization on 06/09/2021 showed normal coronary arteries.  The peak to peak gradient across aortic valve was 22.3 mmHg with a valve area by thermo- dilution of 1.27 cm and by Fick 1.35 cm.  LVEDP was 11.  PA pressure was 42/6 with a mean of 23.  Mean right atrial pressure was 3.  Mean pulmonary wedge pressure was 11.  She had a repeat 2D echocardiogram on 07/25/2021 showing a mean gradient of 27.7 mmHg and a peak gradient of 46.5 mmHg.  Aortic valve area by VTI was 0.87 cm.  Stroke-volume index was 34 with a dimensionless index of 0.28.  Left ventricular ejection fraction  was 45 to 50% with global hypokinesis and grade 1 diastolic dysfunction.  She is referred to TAVR.  During the course of the patient's preoperative work up they have been evaluated comprehensively by a multidisciplinary team of specialists coordinated through the Multidisciplinary Heart Valve Clinic in the [facility].  They have been demonstrated to suffer from symptomatic severe aortic stenosis as noted above. The patient has been counseled extensively as to the relative risks and benefits of all options for the treatment of severe aortic stenosis including long term medical therapy, conventional surgery for aortic valve replacement, and transcatheter aortic valve replacement.  The patient has been independently evaluated in formal cardiac surgical consultation by Dr Laneta Simmers, who deemed the patient appropriate for TAVR. Based upon review of all of the patient's preoperative diagnostic tests they are felt to be candidate for transcatheter aortic valve replacement using the transfemoral approach as an alternative to conventional surgery.    Following the decision to proceed with transcatheter aortic valve replacement, a discussion has been held regarding what types of management strategies would be attempted intraoperatively in the event of life-threatening complications, including whether or not the patient would be considered a candidate for the use of cardiopulmonary bypass and/or conversion to open sternotomy for attempted surgical intervention.  The patient has been advised of a variety of complications that might develop peculiar to this approach including but not limited to risks of death, stroke, paravalvular leak, aortic dissection or other major vascular complications, aortic annulus rupture, device embolization, cardiac rupture or perforation, acute myocardial infarction, arrhythmia, heart block or bradycardia  requiring permanent pacemaker placement, congestive heart failure,  respiratory failure, renal failure, pneumonia, infection, other late complications related to structural valve deterioration or migration, or other complications that might ultimately cause a temporary or permanent loss of functional independence or other long term morbidity.  The patient provides full informed consent for the procedure as described and all questions were answered preoperatively.  DETAILS OF THE OPERATIVE PROCEDURE  PREPARATION:   The patient is brought to the operating room on the above mentioned date and central monitoring was established by the anesthesia team including placement of a radial arterial line. The patient is placed in the supine position on the operating table.  Intravenous antibiotics are administered. The patient is monitored closely throughout the procedure under conscious sedation.     Baseline transthoracic echocardiogram is performed. The patient's chest, abdomen, both groins, and both lower extremities are prepared and draped in a sterile manner. A time out procedure is performed.   PERIPHERAL ACCESS:   Using ultrasound guidance, femoral arterial and venous access is obtained with placement of 6 Fr sheaths on the left side.  [place] images are digitally captured and stored in the patient's chart. A pigtail diagnostic catheter was passed through the femoral arterial sheath under fluoroscopic guidance into the aortic root.  A temporary transvenous pacemaker catheter was passed through the femoral venous sheath under fluoroscopic guidance into the right ventricle.  The pacemaker was tested to ensure stable lead placement and pacemaker capture. Aortic root angiography was performed in order to determine the optimal angiographic angle for valve deployment.  TRANSFEMORAL ACCESS:  A micropuncture technique is used to access the right femoral artery under fluoroscopic and ultrasound guidance.  2 Perclose devices are deployed at 10' and 2' positions to 'PreClose' the femoral  artery. An 8 French sheath is placed and then an Amplatz Superstiff wire is advanced through the sheath. This is changed out for a 14 French transfemoral E-Sheath after progressively dilating over the Superstiff wire.  An AL1 catheter was used to direct a straight-tip exchange length wire across the native aortic valve into the left ventricle. This was exchanged out for a pigtail catheter and position was confirmed in the LV apex. Simultaneous LV and Ao pressures were recorded.  The pigtail catheter was exchanged for an Amplatz Extra-stiff wire in the LV apex.    BALLOON AORTIC VALVULOPLASTY:  Not performed  TRANSCATHETER HEART VALVE DEPLOYMENT:  An Edwards Sapien 3 transcatheter heart valve (size 26 mm) was prepared and crimped per manufacturer's guidelines, and the proper orientation of the valve is confirmed on the [employer] delivery system. The valve was advanced through the introducer sheath using normal technique until in an appropriate position in the abdominal aorta beyond the sheath tip. The balloon was then retracted and using the fine-tuning wheel was centered on the valve. The valve was then advanced across the aortic arch using appropriate flexion of the catheter. The valve was carefully positioned across the aortic valve annulus. The Commander catheter was retracted using normal technique. Once final position of the valve has been confirmed by angiographic assessment, the valve is deployed while temporarily holding ventilation and during rapid ventricular pacing to maintain systolic blood pressure < 50 mmHg and pulse pressure < 10 mmHg. The balloon inflation is held for >3 seconds after reaching full deployment volume. Once the balloon has fully deflated the balloon is retracted into the ascending aorta and valve function is assessed using echocardiography. The patient's hemodynamic recovery following valve deployment is good.  The deployment balloon and guidewire are both removed. Echo  demostrated acceptable post-procedural gradients, stable mitral valve function, and no aortic insufficiency.    PROCEDURE COMPLETION:  The sheath was removed and femoral artery closure is performed using the 2 previously deployed Perclose devices.  Protamine is administered once femoral arterial repair was complete. The site is clear with no evidence of bleeding or hematoma after the sutures are tightened. The temporary pacemaker and pigtail catheters are removed. Mynx closure is used for contralateral femoral arterial hemostasis for the 6 Fr sheath.  The patient tolerated the procedure well and is transported to the recovery area in stable condition. There were no immediate intraoperative complications. All sponge instrument and needle counts are verified correct at completion of the operation.   The patient received a total of 90 mL of intravenous contrast during the procedure.   Orbie Pyo, MD 09/16/2021 2:56 PM

## 2021-09-16 NOTE — Anesthesia Procedure Notes (Signed)
Arterial Line Insertion Start/End10/25/2022 11:50 AM, 09/16/2021 12:00 PM Performed by: Cecile Hearing, MD, anesthesiologist  Patient location: Pre-op. Preanesthetic checklist: patient identified, IV checked, site marked, risks and benefits discussed, surgical consent, monitors and equipment checked, pre-op evaluation, timeout performed and anesthesia consent Lidocaine 1% used for infiltration Right, radial was placed Catheter size: 20 G Hand hygiene performed  and maximum sterile barriers used   Attempts: 1 Procedure performed using ultrasound guided technique. Ultrasound Notes:anatomy identified, needle tip was noted to be adjacent to the nerve/plexus identified, no ultrasound evidence of intravascular and/or intraneural injection and image(s) printed for medical record Following insertion, dressing applied and Biopatch. Post procedure assessment: normal and unchanged  Post procedure complications: second provider assisted and unsuccessful attempts.

## 2021-09-16 NOTE — Interval H&P Note (Signed)
History and Physical Interval Note:  09/16/2021 1:26 PM  Nicole Gates  has presented today for surgery, with the diagnosis of Severe Aortic Stenosis.  The various methods of treatment have been discussed with the patient and family. After consideration of risks, benefits and other options for treatment, the patient has consented to  Procedure(s): TRANSCATHETER AORTIC VALVE REPLACEMENT, TRANSFEMORAL (N/A) TRANSESOPHAGEAL ECHOCARDIOGRAM (TEE) (N/A) as a surgical intervention.  The patient's history has been reviewed, patient examined, no change in status, stable for surgery.  I have reviewed the patient's chart and labs.  Questions were answered to the patient's satisfaction.     Alleen Borne

## 2021-09-17 ENCOUNTER — Encounter (HOSPITAL_COMMUNITY): Payer: Self-pay | Admitting: Cardiovascular Disease

## 2021-09-17 ENCOUNTER — Other Ambulatory Visit: Payer: Self-pay | Admitting: Cardiology

## 2021-09-17 ENCOUNTER — Inpatient Hospital Stay (HOSPITAL_COMMUNITY): Payer: Medicare HMO

## 2021-09-17 DIAGNOSIS — Z006 Encounter for examination for normal comparison and control in clinical research program: Secondary | ICD-10-CM | POA: Diagnosis not present

## 2021-09-17 DIAGNOSIS — I350 Nonrheumatic aortic (valve) stenosis: Principal | ICD-10-CM

## 2021-09-17 DIAGNOSIS — Z952 Presence of prosthetic heart valve: Secondary | ICD-10-CM

## 2021-09-17 DIAGNOSIS — E669 Obesity, unspecified: Secondary | ICD-10-CM | POA: Diagnosis not present

## 2021-09-17 DIAGNOSIS — E039 Hypothyroidism, unspecified: Secondary | ICD-10-CM | POA: Diagnosis not present

## 2021-09-17 LAB — ECHOCARDIOGRAM COMPLETE
AR max vel: 1.95 cm2
AV Area VTI: 2.39 cm2
AV Area mean vel: 2.23 cm2
AV Mean grad: 10 mmHg
AV Peak grad: 18.8 mmHg
Ao pk vel: 2.17 m/s
Area-P 1/2: 4.31 cm2
Calc EF: 48 %
Height: 61 in
S' Lateral: 3.4 cm
Single Plane A2C EF: 47.8 %
Single Plane A4C EF: 48.1 %
Weight: 3329.83 oz

## 2021-09-17 LAB — BASIC METABOLIC PANEL
Anion gap: 8 (ref 5–15)
BUN: 17 mg/dL (ref 8–23)
CO2: 24 mmol/L (ref 22–32)
Calcium: 9.1 mg/dL (ref 8.9–10.3)
Chloride: 106 mmol/L (ref 98–111)
Creatinine, Ser: 0.82 mg/dL (ref 0.44–1.00)
GFR, Estimated: >60 mL/min (ref >60)
Glucose, Bld: 100 mg/dL — ABNORMAL HIGH (ref 70–99)
Potassium: 4.3 mmol/L (ref 3.5–5.1)
Sodium: 138 mmol/L (ref 135–145)

## 2021-09-17 LAB — CBC
HCT: 29.6 % — ABNORMAL LOW (ref 36.0–46.0)
Hemoglobin: 9 g/dL — ABNORMAL LOW (ref 12.0–15.0)
MCH: 25.5 pg — ABNORMAL LOW (ref 26.0–34.0)
MCHC: 30.4 g/dL (ref 30.0–36.0)
MCV: 83.9 fL (ref 80.0–100.0)
Platelets: 245 10*3/uL (ref 150–400)
RBC: 3.53 MIL/uL — ABNORMAL LOW (ref 3.87–5.11)
RDW: 15.2 % (ref 11.5–15.5)
WBC: 8.5 10*3/uL (ref 4.0–10.5)
nRBC: 0 % (ref 0.0–0.2)

## 2021-09-17 MED ORDER — ASPIRIN 81 MG PO CHEW: 81.0000 mg | CHEWABLE_TABLET | Freq: Every day | ORAL | Status: AC

## 2021-09-17 NOTE — Progress Notes (Signed)
Patient given discharge instructions and stated understanding. 

## 2021-09-17 NOTE — Anesthesia Postprocedure Evaluation (Signed)
Anesthesia Post Note  Patient: Nicole Gates  Procedure(s) Performed: TRANSCATHETER AORTIC VALVE REPLACEMENT, TRANSFEMORAL TRANSESOPHAGEAL ECHOCARDIOGRAM (TEE)     Patient location during evaluation: Cath Lab Anesthesia Type: MAC Level of consciousness: awake and alert Pain management: pain level controlled Vital Signs Assessment: post-procedure vital signs reviewed and stable Respiratory status: spontaneous breathing, nonlabored ventilation, respiratory function stable and patient connected to nasal cannula oxygen Cardiovascular status: stable and blood pressure returned to baseline Postop Assessment: no apparent nausea or vomiting Anesthetic complications: no   No notable events documented.  Last Vitals:  Vitals:   09/17/21 0322 09/17/21 0323  BP: (!) 134/55 (!) 134/55  Pulse: 71 72  Resp: 18 20  Temp:    SpO2: 99% 99%    Last Pain:  Vitals:   09/17/21 0658  TempSrc:   PainSc: 3                  Catalina Gravel

## 2021-09-17 NOTE — Progress Notes (Signed)
  Echocardiogram 2D Echocardiogram has been performed.  Nicole Gates 09/17/2021, 10:11 AM

## 2021-09-17 NOTE — Progress Notes (Signed)
CARDIAC REHAB PHASE I   Pt with recent ambulation with mobility tech. States she does not feel a difference in her breathing yet, but was able to ambulate without difficulty. Pt educated on site care, restrictions, and exercise guidelines. Declines CRP II at this time. D/c today.  [id number] Reynold Bowen, RN BSN 09/17/2021 11:58 AM

## 2021-09-17 NOTE — Discharge Summary (Addendum)
[facility]ART [facility]ENTER   MULTIDISCIPLINARY HEART VALVE TEAM  Discharge Summary    Patient ID: Nicole Gates MRN: [id number]; DOB: [birth year]  Admit date: 09/16/2021 Discharge date: 09/17/2021  Primary Care Provider: [facility]  Primary Cardiologist: Dr. Allyson Sabal, MD/ Dr. Clifton James, MD/ Dr. Lynnette Caffey, MD/Dr. Laneta Simmers, MD (TAVR)  Discharge Diagnoses    Principal Problem:   S/P TAVR (transcatheter aortic valve replacement) Active Problems:   HTN (hypertension)   Hypothyroidism   Peripheral neuropathy  Allergies No Known Allergies  Diagnostic Studies/Procedures    TAVR OPERATIVE NOTE     Date of Procedure:                09/16/2021   Preoperative Diagnosis:      Severe Aortic Stenosis    Postoperative Diagnosis:    Same    Procedure:        Transcatheter Aortic Valve Replacement - Percutaneous Right Transfemoral Approach             Edwards Sapien 3 Ultra THV (size 26 mm, model # 9755RSL, serial # [id number])              Co-Surgeons:                        Alleen Borne, MD, Verne Carrow, MD (proctoring physician), and Alverda Skeans, MD   Anesthesiologist:                  Arrie Aran, MD   Echocardiographer:              Lennie Odor, MD   Pre-operative Echo Findings: Moderate to severe aortic stenosis Mild left ventricular systolic function   Post-operative Echo Findings: No paravalvular leak Unchanged left ventricular systolic function _____________   Echo 09/17/21: completed but pending formal read at the time of discharge   History of Present Illness     Nicole Gates is a 80 y.o. female with a history of HTN, hypothyroidism, peripheral neuropathy and severe aortic stenosis who presented to [facility] on 09/16/21 for planned TAVR.   Hospital Course   Nicole Gates was referred to Dr. Clifton James from Dr. Allyson Sabal for further discussion regarding her aortic stenosis and possible TAVR. She was seen recently by Dr. Allyson Sabal to arrange  a right and left heart cath to better evaluate her aortic stenosis. She has been followed prior to that by Dr. Hanley Hays. She has had recent progressive dyspnea on exertion. Cardiac cath 06/09/21 with no evidence of CAD. AV mean gradient 27.21mmHg. Echo from 03/2021 at [facility] showed LVEF over 60%, mean AV gradient 30 mmHg, AVA 0.6 cm2. Repeat echo 07/25/21 at [facility] with LVEF=45-50%, Mild MR. The aortic valve appears stenotic. The leaflets are thickened and calcified. Mean gradient 27 mmHg. AVA 0.87 cm2. SVI 34. Dimensionless index 0.28 felt to represent early severe aortic stenosis. At consult, she was having symptoms of progressive dyspnea on exertion, chest pressure and dizziness with mild LE edema. She was noted to be rather functional and lives in [place], [place] in a senior apartment alone. She is a retired [employer] trader. She has partial dentures on top and bottom and reports that some of her teeth are broken.  She then saw Dr. Chales Salmon 9/12 with recs for extractions of teeth #4 and #31 to decrease the risk of perioperative and postoperative systemic infection and complications. Extraction completed 9/21.   The patient was evaluated by the multidisciplinary valve team and  felt to have severe, symptomatic aortic stenosis and to be a suitable candidate for TAVR, which was set up for 09/16/21.    Severe AS: s/p successful TAVR with a 26 mm Edwards Sapien 3 THV via the TF approach on 09/16/21. Post operative echo pending. Groin sites are stable. ECG with NSR and no high grade heart block however noted to have underlying LBBB which was present prior to TAVR. Medication plan for ASA 81mg  PO QD. She reports prior hx of GI problems with ASA. Currently takes Protonix. Will plan to watch closely in the OP setting. She has ambulated with PT assistant without difficulty. Will discuss the need for SBE at follow up. She has no allergies. Post procedure creatinine stable at 0.82. Hb at 9.0 which appears to be at her  most recent baseline.  Consultants: None    The patient was seen and examined by Dr. who feels that she is stable and ready for discharge today, 09/17/21.  _____________  Discharge Vitals Blood pressure 104/67, pulse 81, temperature 98.2 F (36.8 C), temperature source Oral, resp. rate 12, height 5\' 1"  (1.549 m), weight 94.4 kg, SpO2 95 %.  Filed Weights   09/16/21 1011 09/17/21 0500  Weight: 93.9 kg 94.4 kg   General: Well developed, well nourished, NAD Neck: Negative for carotid bruits. No JVD Lungs:Clear to ausculation bilaterally. Breathing is unlabored. Cardiovascular: RRR with S1 S2. Soft murmur Extremities: No edema. Neuro: Alert and oriented. No focal deficits. No facial asymmetry. MAE spontaneously. Psych: Responds to questions appropriately with normal affect.    Labs & Radiologic Studies    CBC Recent Labs    09/16/21 1532 09/17/21 0213  WBC  --  8.5  HGB 9.2* 9.0*  HCT 27.0* 29.6*  MCV  --  83.9  PLT  --  245   Basic Metabolic Panel Recent Labs    09/18/21 1532 09/17/21 0213  NA 140 138  K 4.2 4.3  CL 104 106  CO2  --  24  GLUCOSE 123* 100*  BUN 18 17  CREATININE 0.70 0.82  CALCIUM  --  9.1   Liver Function Tests No results for input(s): AST, ALT, ALKPHOS, BILITOT, PROT, ALBUMIN in the last 72 hours. No results for input(s): LIPASE, AMYLASE in the last 72 hours. Cardiac Enzymes No results for input(s): CKTOTAL, CKMB, CKMBINDEX, TROPONINI in the last 72 hours. BNP Invalid input(s): POCBNP D-Dimer No results for input(s): DDIMER in the last 72 hours. Hemoglobin A1C No results for input(s): HGBA1C in the last 72 hours. Fasting Lipid Panel No results for input(s): CHOL, HDL, LDLCALC, TRIG, CHOLHDL, LDLDIRECT in the last 72 hours. Thyroid Function Tests No results for input(s): TSH, T4TOTAL, T3FREE, THYROIDAB in the last 72 hours.  Invalid input(s): FREET3 _____________  DG Chest 2 View  Result Date: 09/12/2021 CLINICAL DATA:   Preop for transcatheter aortic valve repair. EXAM: CHEST - 2 VIEW COMPARISON:  August 24, 2018. FINDINGS: Stable cardiomegaly. Both lungs are clear. Probable small hiatal hernia. The visualized skeletal structures are unremarkable. IMPRESSION: No active cardiopulmonary disease. Electronically Signed   By: 09/14/2021 M.D.   On: 09/12/2021 16:42   ECHOCARDIOGRAM LIMITED  Result Date: 09/16/2021    ECHOCARDIOGRAM LIMITED REPORT   Patient Name:   ALFONSO SHACKETT Date of Exam: 09/16/2021 Medical Rec #:  Ronn Melena      Height:       61.0 in Accession #:    09/18/2021     Weight:  207.0 lb Date of Birth:  [birth year]       BSA:          1.917 m Patient Age:    38 years       BP:           149/63 mmHg Patient Gender: F              HR:           75 bpm. Exam Location:  Inpatient Procedure: Limited Echo, Cardiac Doppler and Color Doppler Indications:    I35.0 Nonrheumatic aortic (valve) stenosis  History:        Patient has prior history of Echocardiogram examinations, most                 recent 07/25/2021. Aortic Valve Disease, Signs/Symptoms:Murmur;                 Risk Factors:Hypertension and Sleep Apnea.                 Aortic Valve: 26 mm Sapien prosthetic, stented (TAVR) valve is                 present in the aortic position. Procedure Date: 09/16/2021.  Sonographer:    Sheralyn Boatman RDCS Referring Phys: [id number] Marshae Azam D [facility]  Sonographer Comments: TAVR procedure IMPRESSIONS  1. Echo guided TAVR. 26 mm S3 deployed in good position without evidence of regurgitation or paravalvular leak. No complications. Pre deployment Vmax 2.7 m/s, MG 19 mmHG. Post deployment Vmax 1.4 m/s, MG 4.0 mmHG, EOA 3.33 cm2, DI 0.74. Normal prosthesis. The aortic valve has been repaired/replaced. Aortic valve regurgitation is not visualized. There is a 26 mm Sapien prosthetic (TAVR) valve present in the aortic position. Procedure Date: 09/16/2021. Echo findings are consistent with normal structure and function of the aortic valve  prosthesis.  2. Left ventricular ejection fraction, by estimation, is 50 to 55%. The left ventricle has low normal function.  3. Right ventricular systolic function is normal. The right ventricular size is normal. There is severely elevated pulmonary artery systolic pressure.  4. The mitral valve is degenerative. Mild mitral valve regurgitation. No evidence of mitral stenosis. FINDINGS  Left Ventricle: Left ventricular ejection fraction, by estimation, is 50 to 55%. The left ventricle has low normal function. The left ventricular internal cavity size was normal in size. There is no left ventricular hypertrophy. Abnormal (paradoxical) septal motion, consistent with left bundle branch block. Right Ventricle: The right ventricular size is normal. No increase in right ventricular wall thickness. Right ventricular systolic function is normal. There is severely elevated pulmonary artery systolic pressure. The tricuspid regurgitant velocity is 3.56 m/s, and with an assumed right atrial pressure of 15 mmHg, the estimated right ventricular systolic pressure is 65.7 mmHg. Pericardium: There is no evidence of pericardial effusion. Mitral Valve: The mitral valve is degenerative in appearance. Mild mitral valve regurgitation. No evidence of mitral valve stenosis. Tricuspid Valve: The tricuspid valve is grossly normal. Tricuspid valve regurgitation is mild . No evidence of tricuspid stenosis. Aortic Valve: Echo guided TAVR. 26 mm S3 deployed in good position without evidence of regurgitation or paravalvular leak. No complications. Pre deployment Vmax 2.7 m/s, MG 19 mmHG. Post deployment Vmax 1.4 m/s, MG 4.0 mmHG, EOA 3.33 cm2, DI 0.74. Normal  prosthesis. The aortic valve has been repaired/replaced. Aortic valve regurgitation is not visualized. Aortic valve mean gradient measures 4.0 mmHg. Aortic valve peak gradient measures 8.2 mmHg. Aortic valve area, by VTI  measures 3.33 cm. There is a 26  mm Sapien prosthetic, stented (TAVR)  valve present in the aortic position. Procedure Date: 09/16/2021. Echo findings are consistent with normal structure and function of the aortic valve prosthesis. Aorta: The aortic root and ascending aorta are structurally normal, with no evidence of dilitation. LEFT VENTRICLE PLAX 2D LVIDd:         5.10 cm LVIDs:         3.70 cm LV PW:         1.10 cm LV IVS:        0.90 cm LVOT diam:     2.40 cm LV SV:         123 LV SV Index:   64 LVOT Area:     4.52 cm  LEFT ATRIUM         Index LA diam:    4.00 cm 2.09 cm/m  AORTIC VALVE AV Area (Vmax):    3.57 cm AV Area (Vmean):   2.13 cm AV Area (VTI):     3.33 cm AV Vmax:           143.00 cm/s AV Vmean:          150.100 cm/s AV VTI:            0.369 m AV Peak Grad:      8.2 mmHg AV Mean Grad:      4.0 mmHg LVOT Vmax:         113.00 cm/s LVOT Vmean:        70.600 cm/s LVOT VTI:          0.272 m LVOT/AV VTI ratio: 0.74  AORTA Ao Root diam: 3.40 cm TRICUSPID VALVE TR Peak grad:   50.7 mmHg TR Vmax:        356.00 cm/s  SHUNTS Systemic VTI:  0.27 m Systemic Diam: 2.40 cm Lennie Odor MD Electronically signed by Lennie Odor MD Signature Date/Time: 09/16/2021/3:50:52 PM    Final    Structural Heart Procedure  Result Date: 09/16/2021 See surgical note for result.  Disposition   Pt is being discharged home today in good condition.  Follow-up Plans & Appointments    Follow-up Information     Filbert Schilder, NP. Go on 09/25/2021.   Specialty: Cardiology Why: at 1:30. Please arrive at 1:15pm to your appointment Contact information: [address] [place] [address] [phone]                Discharge Instructions     Call MD for:  difficulty breathing, headache or visual disturbances   Complete by: As directed    Call MD for:  extreme fatigue   Complete by: As directed    Call MD for:  hives   Complete by: As directed    Call MD for:  persistant dizziness or light-headedness   Complete by: As directed    Call MD for:   persistant nausea and vomiting   Complete by: As directed    Call MD for:  redness, tenderness, or signs of infection (pain, swelling, redness, odor or green/yellow discharge around incision site)   Complete by: As directed    Call MD for:  severe uncontrolled pain   Complete by: As directed    Call MD for:  temperature >100.4   Complete by: As directed    Diet - low sodium heart healthy   Complete by: As directed    Discharge instructions   Complete by: As  directed    ACTIVITY AND EXERCISE  Daily activity and exercise are an important part of your recovery. People recover at different rates depending on their general health and type of valve procedure.  Most people recovering from TAVR feel better relatively quickly   No lifting, pushing, pulling more than 10 pounds (examples to avoid: groceries, vacuuming, gardening, golfing):             - For one week with a procedure through the groin.             - For six weeks for procedures through the chest wall or neck. NOTE: You will typically see one of our providers 7-14 days after your procedure to discuss WHEN TO RESUME the above activities.      DRIVING  Do not drive until you are seen for follow up and cleared by a provider. Generally, we ask patient to not drive for 1 week after their procedure.  If you have been told by your doctor in the past that you may not drive, you must talk with him/her before you begin driving again.   DRESSING  Groin site: you may leave the clear dressing over the site for up to one week or until it falls off.   HYGIENE  If you had a femoral (leg) procedure, you may take a shower when you return home. After the shower, pat the site dry. Do NOT use powder, oils or lotions in your groin area until the site has completely healed.  If you had a chest procedure, you may shower when you return home unless specifically instructed not to by your discharging practitioner.             - DO NOT scrub incision;  pat dry with a towel.             - DO NOT apply any lotions, oils, powders to the incision.             - No tub baths / swimming for at least 2 weeks.  If you notice any fevers, chills, increased pain, swelling, bleeding or pus, please contact your doctor.   ADDITIONAL INFORMATION  If you are going to have an upcoming dental procedure, please contact our office as you will require antibiotics ahead of time to prevent infection on your heart valve.    If you have any questions or concerns you can call the structural heart phone during normal business hours 8am-4pm. If you have an urgent need after hours or weekends please call [phone] to talk to the on call provider for general cardiology. If you have an emergency that requires immediate attention, please call 911.    After TAVR Checklist  Check  Test Description  Follow up appointment in 1-2 weeks  You will see our structural heart advanced practice provider. Your incision sites will be checked and you will be cleared to drive and resume all normal activities if you are doing well.    1 month echo and follow up  You will have an echo to check on your new heart valve and be seen back in the office by a structural heart advanced practice provider.  Follow up with your primary cardiologist You will need to be seen by your primary cardiologist in the following 3-6 months after your 1 month appointment in the valve clinic. Often times your Plavix or Aspirin will be discontinued during this time, but this is decided on a case by case basis.  1 year echo and follow up You will have another echo to check on your heart valve after 1 year and be seen back in the office by a structural heart advanced practice provider. This your last structural heart visit.  Bacterial endocarditis prophylaxis  You will have to take antibiotics for the rest of your life before all dental procedures (even teeth cleanings) to protect your heart valve. Antibiotics are  also required before some surgeries. Please check with your cardiologist before scheduling any surgeries. Also, please make sure to tell us if you have a penicillin allergy as you will require an alternative antibiotic.   Increase activity slowly   Complete by: As directed       Discharge Medications   Allergies as of 09/17/2021   No Known Allergies      Medication List     TAKE these medications    aspirin 81 MG chewable tablet Chew 1 tablet (81 mg total) by mouth daily. Start taking on: September 18, 2021   calcium carbonate 1250 (500 Ca) MG chewable tablet Commonly known as: OS-CAL Chew 1 tablet by mouth daily.   estradiol 0.1 MG/GM vaginal cream Commonly known as: ESTRACE Place 1 Applicatorful vaginally at bedtime.   fluticasone 50 MCG/ACT nasal spray Commonly known as: FLONASE Place 1 spray into both nostrils daily.   gabapentin 300 MG capsule Commonly known as: NEURONTIN Take 600 mg by mouth at bedtime.   levothyroxine 100 MCG tablet Commonly known as: SYNTHROID Take 100 mcg by mouth daily before breakfast.   lisinopril 10 MG tablet Commonly known as: ZESTRIL Take 10 mg by mouth daily.   Magnesium 400 MG Tabs Take 400 mg by mouth daily.   mirabegron ER 50 MG Tb24 tablet Commonly known as: MYRBETRIQ Take 50 mg by mouth daily.   niacin 500 MG tablet Take 500 mg by mouth daily.   nystatin cream Commonly known as: MYCOSTATIN Apply 1 application topically 2 (two) times daily.   pantoprazole 40 MG tablet Commonly known as: PROTONIX Take 40 mg by mouth daily.   polyvinyl alcohol 1.4 % ophthalmic solution Commonly known as: LIQUIFILM TEARS Place 1 drop into both eyes daily as needed for dry eyes.   POTASSIUM PO Take 595 mg by mouth daily.   VITAMIN C PO Take 500 mg by mouth daily.   Vitamin D3 125 MCG (5000 UT) Caps Take 5,000 Units by mouth daily.   ZINC PO Take 50 mg by mouth daily.        Outstanding Labs/Studies   CBC  Duration of  Discharge Encounter   Greater than 30 minutes including physician time.  SignedGeorgie Chard, NP 09/17/2021, 11:18 AM [phone]   I have personally seen and examined this patient. I agree with the assessment and plan as outlined above.  Doing well post TAVR. Baseline LBBB. No issues on tele overnight. BP stable.  Groins stable. Echo today.  Discharge home today.   Verne Carrow 09/17/2021 12:49 PM

## 2021-09-17 NOTE — Plan of Care (Signed)

## 2021-09-17 NOTE — Progress Notes (Signed)
Mobility Specialist: Progress Note   09/17/21 1115  Mobility  Activity Ambulated in hall  Level of Assistance Contact guard assist, steadying assist  Assistive Device None  Distance Ambulated (ft) 400 ft  Mobility Ambulated with assistance in hallway  Mobility Response Tolerated well  Mobility performed by Mobility specialist  Bed Position Chair  $Mobility charge 1 Mobility   During Mobility: 102 HR Post-Mobility: 87 HR  Pt sitting EOB upon entering room requesting to use BR. Pt to BR and then agreeable to ambulation. Pt kept R hand on railing in the hallway for balance for her comfort. Pt to recliner after walk with pt's daughter present in the room.   [facility] Natayla Cadenhead Mobility Specialist Mobility Specialist Phone: [phone]

## 2021-09-18 ENCOUNTER — Telehealth: Payer: Self-pay | Admitting: Cardiology

## 2021-09-18 NOTE — Telephone Encounter (Signed)
  HEART AND VASCULAR CENTER   MULTIDISCIPLINARY HEART VALVE TEAM   Patient contacted regarding discharge from [facility] on 09/17/21 s/p TAVR   Patient understands to follow up with provider Georgie Chard on 09/25/21 at [address].  Patient understands discharge instructions? Yes Patient understands medications and regimen? Yes Patient understands to bring all medications to this visit? Yes   Georgie Chard NP-C Structural Heart Team  Pager: [phone]

## 2021-09-25 ENCOUNTER — Ambulatory Visit: Payer: Medicare HMO | Admitting: Cardiology

## 2021-09-25 ENCOUNTER — Encounter: Payer: Self-pay | Admitting: Cardiology

## 2021-09-25 ENCOUNTER — Other Ambulatory Visit: Payer: Self-pay

## 2021-09-25 VITALS — BP 124/74 | HR 80 | Ht 61.0 in | Wt 213.2 lb

## 2021-09-25 DIAGNOSIS — I10 Essential (primary) hypertension: Secondary | ICD-10-CM

## 2021-09-25 DIAGNOSIS — K219 Gastro-esophageal reflux disease without esophagitis: Secondary | ICD-10-CM

## 2021-09-25 DIAGNOSIS — Z952 Presence of prosthetic heart valve: Secondary | ICD-10-CM | POA: Diagnosis not present

## 2021-09-25 DIAGNOSIS — I350 Nonrheumatic aortic (valve) stenosis: Secondary | ICD-10-CM | POA: Diagnosis not present

## 2021-09-25 MED ORDER — AMOXICILLIN 500 MG PO TABS: 500.0000 mg | ORAL_TABLET | ORAL | 6 refills | Status: DC

## 2021-09-25 NOTE — Patient Instructions (Addendum)
Medication Instructions:  Start Amoxicillin 500 mg, take 4 tablets 1 hour prior to dental procedures and cleanings   *If you need a refill on your cardiac medications before your next appointment, please call your pharmacy*   Lab Work: None ordered   If you have labs (blood work) drawn today and your tests are completely normal, you will receive your results only by: MyChart Message (if you have MyChart) OR A paper copy in the mail If you have any lab test that is abnormal or we need to change your treatment, we will call you to review the results.   Testing/Procedures: None ordered    Follow-Up: Follow up as scheduled    Other Instructions None

## 2021-09-25 NOTE — Progress Notes (Signed)
HEART AND VASCULAR CENTER   MULTIDISCIPLINARY HEART VALVE CLINIC                                     Cardiology Office Note:    Date:  09/27/2021   ID:  Nicole Gates, DOB 1941-03-09, MRN [id number]  PCP:  Inc, [facility]  [facility] Cardiologist: Dr. Allyson Sabal, MD/ Dr. Clifton James, MD/ Dr. Lynnette Caffey, MD/Dr. Laneta Simmers, MD (TAVR) [facility] Electrophysiologist:  None   Referring MD: Inc, [facility]   Chief Complaint  Patient presents with   Follow-up    1 week f/u s/p TAVR    History of Present Illness:    Nicole Gates is a 80 y.o. female with a hx of HTN, hypothyroidism, peripheral neuropathy and severe aortic stenosis who presents for 1 week post TAVR follow up performed 09/16/21.   Nicole Gates was referred to Dr. Clifton James from Dr. Allyson Sabal for further discussion regarding her aortic stenosis and possible TAVR. She was seen recently by Dr. Allyson Sabal to arrange a right and left heart cath to better evaluate her aortic stenosis. She had previously been followed by Dr. Hanley Hays however prefers to continue with Dr. Allyson Sabal from here out. She had recent progressive dyspnea on exertion. Echo from 03/2021 at [facility] showed LVEF over 60%, mean AV gradient 30 mmHg, AVA 0.6 cm2. Cardiac cath 06/09/21 with no evidence of CAD, AV mean gradient 27.32mmHg. Repeat echo 07/25/21 at [facility] with LVEF=45-50%, mild MR with a stenotic aortic valve, mean gradient 27 mmHg, AVA 0.87 cm2, SVI 34 and dimensionless index at 0.28 felt to represent early severe aortic stenosis. At structural heart consult, she was having worsening symptoms of progressive dyspnea on exertion, chest pressure and dizziness with mild LE edema. She was noted to be rather functional, living alone in [place], [place] in a senior apartment. She has partial dentures on top and bottom and reported some issues with her remaining teeth therefore she was referred to Dr. Chales Salmon 9/12 with recs for extractions of teeth  #4 and #31 to decrease the risk of perioperative and postoperative systemic infection and complications. Extraction completed 9/21.    The patient was evaluated by the multidisciplinary valve team and felt to have severe, symptomatic aortic stenosis and to be a suitable candidate for TAVR, which was set up for 09/16/21.   She underwent successful TAVR with a 26 mm Edwards Sapien 3 THV via the TF approach on 09/16/21. Post operative echo showed no regurgitation or PVL with normal prosthesis function. EKG showed NSR and no high grade heart block however did show underlying LBBB which was present prior to TAVR. Medication plan for ASA 81mg  PO QD. She had reports of GI problems with ASA and was on PTA Protonix with plans to monitor closely in the OP setting. Post procedure creatinine stable at 0.82. Hb at 9.0 which appears to be at her most recent baseline.   Today she presents with her daughter and reports a significant change in her symptoms since surgery. She states she is no longer short of breath or fatigued and is doing well with no issues. Daughter states she can see such a change in her Moms day to day life. Groin sites stable. Denies chest pain, palpitations, LE edema, orthopnea, dizziness, or syncope.   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 Clifton James, Dr. Lynnette Caffey, Dr. Laneta Simmers   Severe aortic stenosis    Sleep apnea     Past Surgical History:  Procedure Laterality Date   ABDOMINAL HYSTERECTOMY     CHOLECYSTECTOMY     RIGHT/LEFT HEART CATH AND CORONARY ANGIOGRAPHY N/A 06/09/2021   Procedure: RIGHT/LEFT HEART CATH AND CORONARY ANGIOGRAPHY;  Surgeon: Runell Gess, MD;  [facility] INVAS[facility] CV LAB;  Service: Cardiovascular;  Laterality: N/A;   TEE WITHOUT CARDIOVERSION N/A 09/16/2021    Procedure: TRANSESOPHAGEAL ECHOCARDIOGRAM (TEE);  Surgeon: Kathleene Hazel, MD;  Location: [facility] INVASIVE CV LAB;  Service: Open Heart Surgery;  Laterality: N/A;   TRANSCATHETER AORTIC VALVE REPLACEMENT, TRANSFEMORAL N/A 09/16/2021   Procedure: TRANSCATHETER AORTIC VALVE REPLACEMENT, TRANSFEMORAL;  Surgeon: Kathleene Hazel, MD;  Location: [facility] INVASIVE CV LAB;  Service: Open Heart Surgery;  Laterality: N/A;    Current Medications: Current Meds  Medication Sig   amoxicillin (AMOXIL) 500 MG tablet Take 1 tablet (500 mg total) by mouth as directed. Take 4 tablets by mouth 1 hour prior to dental procedures and cleanings   aspirin 81 MG chewable tablet Chew 1 tablet (81 mg total) by mouth daily.   calcium carbonate (OS-CAL) 1250 (500 Ca) MG chewable tablet Chew 1 tablet by mouth daily.   Cholecalciferol (VITAMIN D3) 125 MCG (5000 UT) CAPS Take 5,000 Units by mouth daily.   estradiol (ESTRACE) 0.1 MG/GM vaginal cream Place 1 Applicatorful vaginally at bedtime.   fluticasone (FLONASE) 50 MCG/ACT nasal spray Place 1 spray into both nostrils daily.   gabapentin (NEURONTIN) 300 MG capsule Take 600 mg by mouth at bedtime.   levothyroxine (SYNTHROID) 100 MCG tablet Take 100 mcg by mouth daily before breakfast.   lisinopril (ZESTRIL) 10 MG tablet Take 10 mg by mouth daily.   Magnesium 400 MG TABS Take 400 mg by mouth daily.   mirabegron ER (MYRBETRIQ) 50 MG TB24 tablet Take 50 mg by mouth daily.   Multiple Vitamins-Minerals (ZINC PO) Take 50 mg by mouth daily.   niacin 500 MG tablet Take 500 mg by mouth daily.   nystatin cream (MYCOSTATIN) Apply 1 application topically 2 (two) times daily.   pantoprazole (PROTONIX) 40 MG tablet Take 40 mg by mouth daily.   polyvinyl alcohol (LIQUIFILM TEARS) 1.4 % ophthalmic solution Place 1 drop into both eyes daily as needed for dry eyes.   POTASSIUM PO Take 595 mg by mouth daily.     Allergies:   Patient has no known allergies.   Social History    Socioeconomic History   Marital status: Widowed    Spouse name: Not on file   Number of children: 6   Years of education: Not on file   Highest education level: Not on file  Occupational History   Occupation: Futures trader, drove a school bus, Engineer, petroleum  Tobacco Use   Smoking status: Never   Smokeless tobacco: Never  Vaping Use   Vaping Use: Never used  Substance and Sexual Activity   Alcohol use: Never   Drug use: Never   Sexual activity: Not on file  Other Topics Concern   Not on file  Social History Narrative   Not on file   Social Determinants of Health   Financial Resource Strain: Not on file  Food Insecurity: Not on file  Transportation Needs:  Not on file  Physical Activity: Not on file  Stress: Not on file  Social Connections: Not on file     Family History: The patient's family history includes Cancer in her father; Diabetes in her brother.  ROS:   Please see the history of present illness.    All other systems reviewed and are negative.  EKGs/Labs/Other Studies Reviewed:    The following studies were reviewed today:  TAVR OPERATIVE NOTE     Date of Procedure:                09/16/2021   Preoperative Diagnosis:      Severe Aortic Stenosis    Postoperative Diagnosis:    Same    Procedure:        Transcatheter Aortic Valve Replacement - Percutaneous Right Transfemoral Approach             Edwards Sapien 3 Ultra THV (size 26 mm, model # 9755RSL, serial # [id number])              Co-Surgeons:                        Alleen Borne, MD, Verne Carrow, MD (proctoring physician), and Alverda Skeans, MD   Anesthesiologist:                  Arrie Aran, MD   Echocardiographer:              Lennie Odor, MD   Pre-operative Echo Findings: Moderate to severe aortic stenosis Mild left ventricular systolic function   Post-operative Echo Findings: No paravalvular leak Unchanged left ventricular systolic function _____________   Echo  09/17/21: 26 mm S3 in aortic position. Vmax 2.17 m/s, MG 10 mmHG, EOA 2.39 cm2, DI 0.57. No regurgitation or paravalvular leak. Normal prosthesis. The aortic valve has been repaired/replaced. Aortic valve regurgitation is not visualized. There is a 26 mm  Edwards Sapien prosthetic (TAVR) valve present in the aortic position. Procedure Date: 09/16/2021.   EKG:  EKG is ordered today.  The ekg ordered today demonstrates NSR with LBBBB which was present prior to procedure.   Recent Labs: 09/12/2021: ALT 14 09/17/2021: BUN 17; Creatinine, Ser 0.82; Hemoglobin 9.0; Platelets 245; Potassium 4.3; Sodium 138  Recent Lipid Panel No results found for: CHOL, TRIG, HDL, CHOLHDL, VLDL, LDLCALC, LDLDIRECT  Physical Exam:    VS:  BP 124/74   Pulse 80   Ht 5\' 1"  (1.549 m)   Wt 213 lb 3.2 oz (96.7 kg)   SpO2 93%   BMI 40.28 kg/m     Wt Readings from Last 3 Encounters:  09/25/21 213 lb 3.2 oz (96.7 kg)  09/17/21 208 lb 1.8 oz (94.4 kg)  09/12/21 213 lb 4.8 oz (96.8 kg)    General: Well developed, well nourished, NAD Lungs:Clear to ausculation bilaterally. Breathing is unlabored. Cardiovascular: RRR with S1 S2. Soft murmur Extremities: No edema. Neuro: Alert and oriented. No focal deficits. No facial asymmetry. MAE spontaneously. Psych: Responds to questions appropriately with normal affect.    ASSESSMENT/PLAN:    1. Severe AS: s/p successful TAVR with a 26 mm Edwards Sapien 3 THV via the TF approach on 09/16/21. Post operative echo with on aortic regurgitation or PVL with normal functioning valve. Groin sites are stable with no evidence hematoma. Site care discussed. ECG today with NSR however noted to have underlying LBBB which was present prior to TAVR. Continue ASA 81mg  PO QD monotherapy. SBE  discussed and Rx given for Amoxicillin 2g 1 hour priro to dental procedures including cleanings. Plan for 1 month follow up with repeat echo at that time.   2. HTN: Stable, 124/74 with no need for change  today.   3. GERD: Continue PTA Protonix.   Medication Adjustments/Labs and Tests Ordered: Current medicines are reviewed at length with the patient today.  Concerns regarding medicines are outlined above.  Orders Placed This Encounter  Procedures   EKG 12-Lead   Meds ordered this encounter  Medications   amoxicillin (AMOXIL) 500 MG tablet    Sig: Take 1 tablet (500 mg total) by mouth as directed. Take 4 tablets by mouth 1 hour prior to dental procedures and cleanings    Dispense:  12 tablet    Refill:  6    Patient Instructions  Medication Instructions:  Start Amoxicillin 500 mg, take 4 tablets 1 hour prior to dental procedures and cleanings   *If you need a refill on your cardiac medications before your next appointment, please call your pharmacy*   Lab Work: None ordered   If you have labs (blood work) drawn today and your tests are completely normal, you will receive your results only by: MyChart Message (if you have MyChart) OR A paper copy in the mail If you have any lab test that is abnormal or we need to change your treatment, we will call you to review the results.   Testing/Procedures: None ordered    Follow-Up: Follow up as scheduled    Other Instructions None     Signed, Georgie Chard, NP  09/27/2021 3:57 PM    [facility]

## 2021-09-27 ENCOUNTER — Encounter: Payer: Self-pay | Admitting: Cardiology

## 2021-10-01 ENCOUNTER — Ambulatory Visit: Payer: Medicare HMO | Admitting: Cardiovascular Disease

## 2021-10-17 NOTE — Progress Notes (Signed)
HEART AND VASCULAR CENTER   MULTIDISCIPLINARY HEART VALVE CLINIC                                     Cardiology Office Note:    Date:  10/20/2021   ID:  Nicole Gates, DOB 05/28/1941, MRN [id number]  PCP:  Inc, [facility]  [facility] Cardiologist:  Dr. Allyson Sabal, MD/ Dr. Clifton James, MD/ Dr. Lynnette Caffey, MD/Dr. Laneta Simmers, MD (TAVR) [facility] Electrophysiologist:  None   Referring MD: Inc, [facility]   Chief Complaint  Patient presents with   Follow-up    1 month s/p TAVR    History of Present Illness:    Nicole Gates is a 80 y.o. female with a hx of HTN, hypothyroidism, peripheral neuropathy and severe aortic stenosis who presents for 1 week post TAVR follow up performed 09/16/21.    Nicole Gates was referred to Dr. Clifton James from Dr. Allyson Sabal for further discussion regarding her aortic stenosis and possible TAVR. She was seen recently by Dr. Allyson Sabal to arrange a right and left heart cath to better evaluate her aortic stenosis. She had previously been followed by Dr. Hanley Hays however prefers to continue with Dr. Allyson Sabal from here out. She had recent progressive dyspnea on exertion. Echo from 03/2021 at [facility] showed LVEF over 60%, mean AV gradient 30 mmHg, AVA 0.6 cm2. Cardiac cath 06/09/21 with no evidence of CAD, AV mean gradient 27.76mmHg. Repeat echo 07/25/21 at [facility] with LVEF=45-50%, mild MR with a stenotic aortic valve, mean gradient 27 mmHg, AVA 0.87 cm2, SVI 34 and dimensionless index at 0.28 felt to represent early severe aortic stenosis. At structural heart consult, she was having worsening symptoms of progressive dyspnea on exertion, chest pressure and dizziness with mild LE edema. She was noted to be rather functional, living alone in [place], [place] in a senior apartment. She has partial dentures on top and bottom and reported some issues with her remaining teeth therefore she was referred to Dr. Chales Salmon 9/12 with recs for extractions of teeth  #4 and #31 to decrease the risk of perioperative and postoperative systemic infection and complications. Extraction completed 9/21.    The patient was evaluated by the multidisciplinary valve team and felt to have severe, symptomatic aortic stenosis and to be a suitable candidate for TAVR, which was set up for 09/16/21.    She underwent successful TAVR with a 26 mm Edwards Sapien 3 THV via the TF approach on 09/16/21. Post operative echo showed no regurgitation or PVL with normal prosthesis function. EKG showed NSR and no high grade heart block however did show underlying LBBB which was present prior to TAVR. Medication plan for ASA 81mg  PO QD. She had reports of GI problems with ASA and was on PTA Protonix with plans to monitor closely in the OP setting. Post procedure creatinine stable at 0.82. Hb at 9.0 which appears to be at her most recent baseline.   She was then seen by myself in one week follow up and was doing very well. Today she presents with her daughter and continues to do great. She denies SOB, LE edema, PND, palpitations, dizziness, or syncope. She followed last week with her PCP at which time she reports that her BP was elevated with SBP in the 150's. She also states that she was placed on iron supplements and has a recheck with her next week. BP  is well controlled today. She is in the process of getting a home cuff to monitor. We discussed increasing lisinopril if needed at follow up.   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 Clifton James, Dr. Lynnette Caffey, Dr. Laneta Simmers   Severe aortic stenosis    Sleep apnea     Past Surgical History:  Procedure Laterality Date   ABDOMINAL HYSTERECTOMY     CHOLECYSTECTOMY     RIGHT/LEFT HEART CATH AND CORONARY ANGIOGRAPHY N/A 06/09/2021   Procedure:  RIGHT/LEFT HEART CATH AND CORONARY ANGIOGRAPHY;  Surgeon: Runell Gess, MD;  Location: [facility] INVASIVE CV LAB;  Service: Cardiovascular;  Laterality: N/A;   TEE WITHOUT CARDIOVERSION N/A 09/16/2021   Procedure: TRANSESOPHAGEAL ECHOCARDIOGRAM (TEE);  Surgeon: Kathleene Hazel, MD;  Location: [facility] INVASIVE CV LAB;  Service: Open Heart Surgery;  Laterality: N/A;   TRANSCATHETER AORTIC VALVE REPLACEMENT, TRANSFEMORAL N/A 09/16/2021   Procedure: TRANSCATHETER AORTIC VALVE REPLACEMENT, TRANSFEMORAL;  Surgeon: Kathleene Hazel, MD;  Location: [facility] INVASIVE CV LAB;  Service: Open Heart Surgery;  Laterality: N/A;    Current Medications: Current Meds  Medication Sig   amoxicillin (AMOXIL) 500 MG tablet Take 1 tablet (500 mg total) by mouth as directed. Take 4 tablets by mouth 1 hour prior to dental procedures and cleanings   Ascorbic Acid (VITAMIN C PO) Take 500 mg by mouth daily.   aspirin 81 MG chewable tablet Chew 1 tablet (81 mg total) by mouth daily.   calcium carbonate (OS-CAL) 1250 (500 Ca) MG chewable tablet Chew 1 tablet by mouth daily.   Cholecalciferol (VITAMIN D3) 125 MCG (5000 UT) CAPS Take 5,000 Units by mouth daily.   estradiol (ESTRACE) 0.1 MG/GM vaginal cream Place 1 Applicatorful vaginally at bedtime.   fluticasone (FLONASE) 50 MCG/ACT nasal spray Place 1 spray into both nostrils daily.   gabapentin (NEURONTIN) 300 MG capsule Take 600 mg by mouth at bedtime.   levothyroxine (SYNTHROID) 100 MCG tablet Take 100 mcg by mouth daily before breakfast.   lisinopril (ZESTRIL) 10 MG tablet Take 10 mg by mouth daily.   Magnesium 400 MG TABS Take 400 mg by mouth daily.   mirabegron ER (MYRBETRIQ) 50 MG TB24 tablet Take 50 mg by mouth daily.   Multiple Vitamins-Minerals (ZINC PO) Take 50 mg by mouth daily.   niacin 500 MG tablet Take 500 mg by mouth daily.   nystatin cream (MYCOSTATIN) Apply 1 application topically 2 (two) times daily.   pantoprazole (PROTONIX) 40 MG tablet Take 40 mg  by mouth daily.   polyvinyl alcohol (LIQUIFILM TEARS) 1.4 % ophthalmic solution Place 1 drop into both eyes daily as needed for dry eyes.   POTASSIUM PO Take 595 mg by mouth daily.     Allergies:   Patient has no known allergies.   Social History   Socioeconomic History   Marital status: Widowed    Spouse name: Not on file   Number of children: 6   Years of education: Not on file   Highest education level: Not on file  Occupational History   Occupation: Futures trader, drove a school bus, Engineer, petroleum  Tobacco Use   Smoking status: Never   Smokeless tobacco: Never  Vaping Use   Vaping Use: Never used  Substance and Sexual Activity  Alcohol use: Never   Drug use: Never   Sexual activity: Not on file  Other Topics Concern   Not on file  Social History Narrative   Not on file   Social Determinants of Health   Financial Resource Strain: Not on file  Food Insecurity: Not on file  Transportation Needs: Not on file  Physical Activity: Not on file  Stress: Not on file  Social Connections: Not on file     Family History: The patient's family history includes Cancer in her father; Diabetes in her brother.  ROS:   Please see the history of present illness.    All other systems reviewed and are negative.  EKGs/Labs/Other Studies Reviewed:    The following studies were reviewed today:  Echocardiogram 10/20/21:   1. Left ventricular ejection fraction, by estimation, is 50 to 55%. The  left ventricle has low normal function. The left ventricle has no regional  wall motion abnormalities. There is mild left ventricular hypertrophy.  Left ventricular diastolic  parameters are consistent with Grade II diastolic dysfunction  (pseudonormalization). Elevated left atrial pressure.   2. Right ventricular systolic function is normal. The right ventricular  size is normal. There is mildly elevated pulmonary artery systolic  pressure. The estimated right ventricular systolic  pressure is 40.2 mmHg.   3. Left atrial size was severely dilated.   4. Right atrial size was mildly dilated.   5. The mitral valve is degenerative. Mild mitral valve regurgitation.  Mild mitral stenosis. MG at 69bpm, MVA 1.8 cm^2 by continuity  equation   6. Tricuspid valve regurgitation is mild to moderate.   7. The inferior vena cava is normal in size with greater than 50%  respiratory variability, suggesting right atrial pressure of 3 mmHg.   8. There is a 26 mm Edwards Sapien prosthetic, stented (TAVR) valve  present in the aortic position.      Aortic valve regurgitation is not visualized. Vmax 2.7 m/s, MG ,  AT , EOA 1.4 cm^2, DI 0.4   Aortic Valve: The aortic valve has been repaired/replaced. Aortic valve  regurgitation is not visualized. Aortic valve mean gradient measures 15.0  mmHg. Aortic valve peak gradient measures 29.3 mmHg. Aortic valve area, by  VTI measures 1.50 cm. There is a   26 mm Edwards Sapien prosthetic, stented (TAVR) valve present in the  aortic position.  TAVR OPERATIVE NOTE     Date of Procedure:                09/16/2021   Preoperative Diagnosis:      Severe Aortic Stenosis    Postoperative Diagnosis:    Same    Procedure:        Transcatheter Aortic Valve Replacement - Percutaneous Right Transfemoral Approach             Edwards Sapien 3 Ultra THV (size 26 mm, model # 9755RSL, serial # [id number])              Co-Surgeons:                        Alleen Borne, MD, Verne Carrow, MD (proctoring physician), and Alverda Skeans, MD   Anesthesiologist:                  Arrie Aran, MD   Echocardiographer:              Lennie Odor, MD   Pre-operative Echo Findings: Moderate to  severe aortic stenosis Mild left ventricular systolic function   Post-operative Echo Findings: No paravalvular leak Unchanged left ventricular systolic function _____________   Echo 09/17/21: 26 mm S3 in aortic position. Vmax 2.17 m/s, MG 10  mmHG, EOA 2.39 cm2, DI 0.57. No regurgitation or paravalvular leak. Normal prosthesis. The aortic valve has been repaired/replaced. Aortic valve regurgitation is not visualized. There is a 26 mm  Edwards Sapien prosthetic (TAVR) valve present in the aortic position. Procedure Date: 09/16/2021.   EKG:  EKG is not ordered today.    Recent Labs: 09/12/2021: ALT 14 09/17/2021: BUN 17; Creatinine, Ser 0.82; Hemoglobin 9.0; Platelets 245; Potassium 4.3; Sodium 138  Recent Lipid Panel No results found for: CHOL, TRIG, HDL, CHOLHDL, VLDL, LDLCALC, LDLDIRECT  Physical Exam:    VS:  BP 132/66   Pulse 80   Ht 5' 1.5" (1.562 m)   Wt 210 lb 6.4 oz (95.4 kg)   SpO2 98%   BMI 39.11 kg/m     Wt Readings from Last 3 Encounters:  10/20/21 210 lb 6.4 oz (95.4 kg)  09/25/21 213 lb 3.2 oz (96.7 kg)  09/17/21 208 lb 1.8 oz (94.4 kg)    General: Well developed, well nourished, NAD Lungs:Clear to ausculation bilaterally. No wheezes, rales, or rhonchi. Breathing is unlabored. Cardiovascular: RRR with S1 S2. Soft flow murmur Extremities: No edema. Neuro: Alert and oriented. No focal deficits. No facial asymmetry. MAE spontaneously. Psych: Responds to questions appropriately with normal affect.    ASSESSMENT/PLAN:    1. Severe AS: s/p successful TAVR with a 26 mm Edwards Sapien 3 THV via the TF approach on 09/16/21. Post operative echo with on aortic regurgitation or PVL with normal functioning valve. Repeat echo today 10/20/21 with mean gradient measures 15.0 mmHg, peak gradient measures 29.3 mmHg. AVA by VTI measuring 1.50 cm. Tolerating ASA PO QD monotherapy. Has NYHA class II symptoms. SBE discussed>>Rx'd for Amox. Has follow up with Dr. Allyson Sabal 01/2022. Will follow with structural in one year from procedure with echo.   2. HTN: Stable, 132/66 with no need for change today. Reports elevated at PCP office. In the process of obtaining BP cuff for home monitoring. May need increase lisinopril to 20mg  PO QD.  Dicussed BP monitoring techniques.    3. GERD: Continue PTA Protonix.   Medication Adjustments/Labs and Tests Ordered: Current medicines are reviewed at length with the patient today.  Concerns regarding medicines are outlined above.  No orders of the defined types were placed in this encounter.  No orders of the defined types were placed in this encounter.   Patient Instructions  Medication Instructions:  Your physician recommends that you continue on your current medications as directed. Please refer to the Current Medication list given to you today.  *If you need a refill on your cardiac medications before your next appointment, please call your pharmacy*   Lab Work: None ordered   If you have labs (blood work) drawn today and your tests are completely normal, you will receive your results only by: MyChart Message (if you have MyChart) OR A paper copy in the mail If you have any lab test that is abnormal or we need to change your treatment, we will call you to review the results.   Testing/Procedures: None ordered    Follow-Up: Follow up as scheduled    Other Instructions None     Signed, , NP  10/20/2021 12:59 PM    [facility]

## 2021-10-20 ENCOUNTER — Encounter: Payer: Self-pay | Admitting: Cardiology

## 2021-10-20 ENCOUNTER — Ambulatory Visit (HOSPITAL_COMMUNITY): Payer: Medicare HMO | Attending: Cardiology

## 2021-10-20 ENCOUNTER — Ambulatory Visit: Payer: Medicare HMO | Admitting: Cardiology

## 2021-10-20 ENCOUNTER — Other Ambulatory Visit: Payer: Self-pay

## 2021-10-20 VITALS — BP 132/66 | HR 80 | Ht 61.5 in | Wt 210.4 lb

## 2021-10-20 DIAGNOSIS — Z952 Presence of prosthetic heart valve: Secondary | ICD-10-CM | POA: Insufficient documentation

## 2021-10-20 DIAGNOSIS — K219 Gastro-esophageal reflux disease without esophagitis: Secondary | ICD-10-CM | POA: Diagnosis not present

## 2021-10-20 DIAGNOSIS — I10 Essential (primary) hypertension: Secondary | ICD-10-CM

## 2021-10-20 DIAGNOSIS — I350 Nonrheumatic aortic (valve) stenosis: Secondary | ICD-10-CM

## 2021-10-20 LAB — ECHOCARDIOGRAM COMPLETE
AR max vel: 1.38 cm2
AV Area VTI: 1.5 cm2
AV Area mean vel: 1.49 cm2
AV Mean grad: 15 mmHg
AV Peak grad: 29.3 mmHg
Ao pk vel: 2.71 m/s
Area-P 1/2: 3.27 cm2
S' Lateral: 3.3 cm

## 2021-10-20 NOTE — Patient Instructions (Signed)

## 2021-10-22 ENCOUNTER — Other Ambulatory Visit (HOSPITAL_COMMUNITY): Payer: Medicare HMO

## 2021-10-22 ENCOUNTER — Ambulatory Visit: Payer: Medicare HMO | Admitting: Physician Assistant

## 2021-10-23 ENCOUNTER — Ambulatory Visit: Payer: Medicare HMO | Admitting: Physician Assistant

## 2022-02-18 ENCOUNTER — Ambulatory Visit: Payer: Medicare HMO | Admitting: Cardiovascular Disease

## 2022-02-18 ENCOUNTER — Other Ambulatory Visit: Payer: Self-pay

## 2022-02-18 ENCOUNTER — Encounter: Payer: Self-pay | Admitting: Cardiovascular Disease

## 2022-02-18 DIAGNOSIS — I10 Essential (primary) hypertension: Secondary | ICD-10-CM | POA: Diagnosis not present

## 2022-02-18 DIAGNOSIS — I350 Nonrheumatic aortic (valve) stenosis: Secondary | ICD-10-CM | POA: Diagnosis not present

## 2022-02-18 NOTE — Assessment & Plan Note (Signed)
History of essential hypertension blood pressure measured today 124/74.  She is on lisinopril. ?

## 2022-02-18 NOTE — Patient Instructions (Signed)
Medication Instructions:  ?Your physician recommends that you continue on your current medications as directed. Please refer to the Current Medication list given to you today. ? ?*If you need a refill on your cardiac medications before your next appointment, please call your pharmacy* ? ? ?Testing/Procedures: ?Your physician has requested that you have an echocardiogram. Echocardiography is a painless test that uses sound waves to create images of your heart. It provides your doctor with information about the size and shape of your heart and how well your heart?s chambers and valves are working. This procedure takes approximately one hour. There are no restrictions for this procedure. To be done in November. This procedure will be done at [address] [place] ? ? ? ?Follow-Up: ?At [facility], you and your health needs are our priority.  As part of our continuing mission to provide you with exceptional heart care, we have created designated Provider Care Teams.  These Care Teams include your primary Cardiologist (physician) and Advanced Practice Providers (APPs -  Physician Assistants and Nurse Practitioners) who all work together to provide you with the care you need, when you need it. ? ?We recommend signing up for the patient portal called "MyChart".  Sign up information is provided on this After Visit Summary.  MyChart is used to connect with patients for Virtual Visits (Telemedicine).  Patients are able to view lab/test results, encounter notes, upcoming appointments, etc.  Non-urgent messages can be sent to your provider as well.   ?To learn more about what you can do with MyChart, go to [email or web address].   ? ?Your next appointment:   ?12 month(s) ? ?The format for your next appointment:   ?In Person ? ?Provider:   ?Nanetta Batty, MD ?

## 2022-02-18 NOTE — Progress Notes (Signed)
? ? ? ?02/18/2022 ?Ronn Melena   ?08/05/1941  ?[id number] ? ?[facility] ?Primary Cardiologist: Runell Gess MD [name], [place] ? ?HPI:  Nicole Gates is a 81 y.o.  moderately overweight widowed Caucasian female mother of 66, grandmother of 9 grandchildren who is accompanied by one of her daughters Herbert Seta today.  She was referred by Dr. Hanley Hays, her cardiologist, for right left heart cath because of symptomatic severe aortic stenosis.  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  She has no cardiac risk factors other than treated hypertension.  She never smoked.  Her brother did die of a myocardial infarction.  She is never had a heart attack or stroke.  She denies chest pain but has had progressive dyspnea.  She also has obstructive sleep apnea on CPAP.  She had a 2D echo performed by Dr. Hanley Hays revealing normal LV systolic function with severe aortic stenosis.  Valve area was 0.6 cm? with a peak gradient of 57 mmHg.  A Myoview stress test was normal as well.  She has noticed increasing dyspnea on exertion when doing normal activity. ? ?She underwent right left heart cath by myself 06/09/2021 revealing normal coronary arteries and severe aortic stenosis.  I referred her to the structural heart clinic and she ultimately underwent TAVR by Drs. Gevena Cotton and [place] on 09/16/2021 with an Randa Evens SAPIEN 3 ultra THV 26 mm bioprosthetic valve.  She was discharged home the following day.  She is clinically improved.  Her most recent follow-up 2D echocardiogram performed 10/12/2021 revealed normal LV systolic function with a well-functioning aortic bioprosthesis.  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 ? ? ?Current Meds  ?Medication Sig  ? amoxicillin (AMOXIL) 500 MG tablet Take 1 tablet (500 mg  total) by mouth as directed. Take 4 tablets by mouth 1 hour prior to dental procedures and cleanings  ? Ascorbic Acid (VITAMIN C PO) Take 500 mg by mouth daily.  ? aspirin 81 MG chewable tablet Chew 1 tablet (81 mg total) by mouth daily.  ? calcium carbonate (OS-CAL) 1250 (500 Ca) MG chewable tablet Chew 1 tablet by mouth daily.  ? Cholecalciferol (VITAMIN D3) 125 MCG (5000 UT) CAPS Take 5,000 Units by mouth daily.  ? estradiol (ESTRACE) 0.1 MG/GM vaginal cream Place 1 Applicatorful vaginally at bedtime.  ? fluticasone (FLONASE) 50 MCG/ACT nasal spray Place 1 spray into both nostrils daily.  ? gabapentin (NEURONTIN) 300 MG capsule Take 600 mg by mouth at bedtime.  ? levothyroxine (SYNTHROID) 100 MCG tablet Take 100 mcg by mouth daily before breakfast.  ? lisinopril (ZESTRIL) 10 MG tablet Take 10 mg by mouth daily.  ? Magnesium 400 MG TABS Take 400 mg by mouth daily.  ? mirabegron ER (MYRBETRIQ) 50 MG TB24 tablet Take 50 mg by mouth daily.  ? Multiple Vitamins-Minerals (ZINC PO) Take 50 mg by mouth daily.  ? niacin 500 MG tablet Take 500 mg by mouth daily.  ? nystatin cream (MYCOSTATIN) Apply 1 application topically 2 (two) times daily.  ? pantoprazole (PROTONIX) 40 MG tablet Take 40 mg by mouth daily.  ? polyvinyl alcohol (LIQUIFILM TEARS) 1.4 % ophthalmic solution Place 1 drop into both eyes daily as needed for dry eyes.  ? POTASSIUM PO Take 595 mg by mouth daily.  ?  ? ?  No Known Allergies ? ?Social History  ? ?Socioeconomic History  ? Marital status: Widowed  ?  Spouse name: Not on file  ? Number of children: 6  ? Years of education: Not on file  ? Highest education level: Not on file  ?Occupational History  ? Occupation: Futures trader, drove a school bus, Engineer, petroleum  ?Tobacco Use  ? Smoking status: Never  ? Smokeless tobacco: Never  ?Vaping Use  ? Vaping Use: Never used  ?Substance and Sexual Activity  ? Alcohol use: Never  ? Drug use: Never  ? Sexual activity: Not on file  ?Other Topics Concern  ? Not on file   ?Social History Narrative  ? Not on file  ? ?Social Determinants of Health  ? ?Financial Resource Strain: Not on file  ?Food Insecurity: Not on file  ?Transportation Needs: Not on file  ?Physical Activity: Not on file  ?Stress: Not on file  ?Social Connections: Not on file  ?Intimate Partner Violence: Not on file  ?  ? ?Review of Systems: ?General: negative for chills, fever, night sweats or weight changes.  ?Cardiovascular: negative for chest pain, dyspnea on exertion, edema, orthopnea, palpitations, paroxysmal nocturnal dyspnea or shortness of breath ?Dermatological: negative for rash ?Respiratory: negative for cough or wheezing ?Urologic: negative for hematuria ?Abdominal: negative for nausea, vomiting, diarrhea, bright red blood per rectum, melena, or hematemesis ?Neurologic: negative for visual changes, syncope, or dizziness ?All other systems reviewed and are otherwise negative except as noted above. ? ? ? ?Blood pressure 124/74, pulse 67, height 5' 1.5" (1.562 m), weight 214 lb 12.8 oz (97.4 kg), SpO2 94 %.  ?General appearance: alert and no distress ?Neck: no adenopathy, no carotid bruit, no JVD, supple, symmetrical, trachea midline, and thyroid not enlarged, symmetric, no tenderness/mass/nodules ?Lungs: clear to auscultation bilaterally ?Heart: Soft outflow tract murmur ?Extremities: extremities normal, atraumatic, no cyanosis or edema ?Pulses: 2+ and symmetric ?Skin: Skin color, texture, turgor normal. No rashes or lesions ?Neurologic: Grossly normal ? 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  I personally reviewed this EKG. ? ?ASSESSMENT AND PLAN:  ? ?Severe aortic stenosis ?History of severe aortic stenosis status post right left heart cath by myself 06/09/2021 revealing normal coronary arteries and severe aortic stenosis.  She underwent successful TAVR by Dr. Clifton James and [facility] 09/16/2021 with an excellent result.  She is discharged home the next day.  Her most recent 2D echo  performed 10/20/2021 revealed normal LV systolic function with a well-functioning aortic bioprosthesis.  She is clinically improved.  She no longer has chest pain or shortness of breath. ? ?HTN (hypertension) ?History of essential hypertension blood pressure measured today 124/74.  She is on lisinopril. ? ? ? ? ?Runell Gess MD FACP,FACC,FAHA, FSCAI ?02/18/2022 ?11:22 AM ?

## 2022-02-18 NOTE — Assessment & Plan Note (Signed)
History of severe aortic stenosis status post right left heart cath by myself 06/09/2021 revealing normal coronary arteries and severe aortic stenosis.  She underwent successful TAVR by Dr. Clifton James and [facility] 09/16/2021 with an excellent result.  She is discharged home the next day.  Her most recent 2D echo performed 10/20/2021 revealed normal LV systolic function with a well-functioning aortic bioprosthesis.  She is clinically improved.  She no longer has chest pain or shortness of breath. ?

## 2022-03-13 DIAGNOSIS — N9089 Other specified noninflammatory disorders of vulva and perineum: Secondary | ICD-10-CM | POA: Insufficient documentation

## 2022-06-17 ENCOUNTER — Other Ambulatory Visit: Payer: Self-pay | Admitting: Physician Assistant

## 2022-06-17 DIAGNOSIS — Z952 Presence of prosthetic heart valve: Secondary | ICD-10-CM

## 2022-08-14 ENCOUNTER — Ambulatory Visit: Payer: Medicare HMO | Admitting: Physician Assistant

## 2022-08-14 ENCOUNTER — Ambulatory Visit (HOSPITAL_COMMUNITY): Payer: Medicare HMO | Attending: Physician Assistant

## 2022-08-14 VITALS — BP 120/72 | HR 65 | Ht 61.0 in | Wt 213.0 lb

## 2022-08-14 DIAGNOSIS — Z952 Presence of prosthetic heart valve: Secondary | ICD-10-CM | POA: Diagnosis not present

## 2022-08-14 DIAGNOSIS — K219 Gastro-esophageal reflux disease without esophagitis: Secondary | ICD-10-CM

## 2022-08-14 DIAGNOSIS — I10 Essential (primary) hypertension: Secondary | ICD-10-CM | POA: Insufficient documentation

## 2022-08-14 DIAGNOSIS — R600 Localized edema: Secondary | ICD-10-CM | POA: Insufficient documentation

## 2022-08-14 NOTE — Patient Instructions (Signed)
Medication Instructions:  Your physician recommends that you continue on your current medications as directed. Please refer to the Current Medication list given to you today.  *If you need a refill on your cardiac medications before your next appointment, please call your pharmacy*  Lab Work: If you have labs (blood work) drawn today and your tests are completely normal, you will receive your results only by: [place] (if you have MyChart) OR A paper copy in the mail If you have any lab test that is abnormal or we need to change your treatment, we will call you to review the results.  Testing/Procedures: None ordered today .  Follow-Up: At [facility], you and your health needs are our priority.  As part of our continuing mission to provide you with exceptional heart care, we have created designated Provider Care Teams.  These Care Teams include your primary Cardiologist (physician) and Advanced Practice Providers (APPs -  Physician Assistants and Nurse Practitioners) who all work together to provide you with the care you need, when you need it.  We recommend signing up for the patient portal called "MyChart".  Sign up information is provided on this After Visit Summary.  MyChart is used to connect with patients for Virtual Visits (Telemedicine).  Patients are able to view lab/test results, encounter notes, upcoming appointments, etc.  Non-urgent messages can be sent to your provider as well.   To learn more about what you can do with MyChart, go to [email or web address].    Your next appointment:   6 month(s)  The format for your next appointment:   In Person  Provider:   Quay Burow, MD     Important Information About Sugar

## 2022-08-14 NOTE — Progress Notes (Unsigned)
[facility] [place]                                     Cardiology Office Note:    Date:  08/18/2022   ID:  Nicole Gates, DOB 1941/07/13, MRN [id number]  PCP:  [facility]  [place] Cardiologist:  Dr. Gwenlyn Found, MD/ Dr. Angelena Form, MD/ Dr. Ali Lowe, MD/Dr. Cyndia Bent, MD (TAVR) [facility] Electrophysiologist:  None   Referring MD: [facility]   1 year s/p TAVR  History of Present Illness:    Nicole Gates is a 81 y.o. female with a hx of HTN, hypothyroidism, peripheral neuropathy and severe aortic stenosis s/p TAVR 09/16/21 who presents for follow up.    Echo from 03/2021 at [facility] showed LVEF over 60%, mean AV gradient 30 mmHg, AVA 0.6 cm2. Cardiac cath 06/09/21 with no evidence of CAD, AV mean gradient 27.80mmHg. Repeat echo 07/25/21 at [facility] with LVEF=45-50%, mild MR with a stenotic aortic valve, mean gradient 27 mmHg, AVA 0.87 cm2, SVI 34 and dimensionless index at 0.28 felt to represent early severe aortic stenosis. She was having worsening symptoms of progressive dyspnea on exertion, chest pressure and dizziness with mild LE edema. Dental extraction completed 9/21.    The patient was evaluated by the multidisciplinary valve team and underwent successful TAVR with a 26 mm Edwards Sapien 3 THV via the TF approach on 09/16/21. Post operative echo showed no regurgitation or PVL with normal prosthesis function. Discharged on ASA 81mg  PO QD.   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  Today the patient presents to clinic for follow up. No CP or SOB. Has LE edema for which she was just started on HCTZ by PCP. No orthopnea or PND. No dizziness or syncope. No blood in stool or urine. No palpitations.     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 Angelena Form, Dr. Ali Lowe, Dr. Cyndia Bent   Severe aortic stenosis    Sleep apnea     Past Surgical History:  Procedure Laterality Date   ABDOMINAL HYSTERECTOMY     CHOLECYSTECTOMY     RIGHT/LEFT HEART CATH AND CORONARY ANGIOGRAPHY N/A 06/09/2021   Procedure: RIGHT/LEFT HEART CATH AND CORONARY ANGIOGRAPHY;  Surgeon: Lorretta Harp, MD;  Location: [place] CV LAB;  Service: Cardiovascular;  Laterality: N/A;   TEE WITHOUT CARDIOVERSION N/A 09/16/2021   Procedure: TRANSESOPHAGEAL ECHOCARDIOGRAM (TEE);  Surgeon: Burnell Blanks, MD;  Location: [place] CV LAB;  Service: Open Heart Surgery;  Laterality: N/A;   TRANSCATHETER AORTIC VALVE REPLACEMENT, TRANSFEMORAL N/A 09/16/2021   Procedure: TRANSCATHETER AORTIC VALVE REPLACEMENT, TRANSFEMORAL;  Surgeon: Burnell Blanks, MD;  Location: [place] CV LAB;  Service: Open Heart Surgery;  Laterality: N/A;    Current Medications: Current Meds  Medication Sig   amoxicillin (AMOXIL) 500 MG tablet Take 1 tablet (500 mg total) by mouth as directed. Take 4 tablets by mouth 1 hour prior to dental procedures and cleanings   Ascorbic Acid (VITAMIN C PO) Take 500 mg by mouth daily.   aspirin 81 MG chewable  tablet Chew 1 tablet (81 mg total) by mouth daily.   calcium carbonate (OS-CAL) 1250 (500 Ca) MG chewable tablet Chew 1 tablet by mouth daily.   Cholecalciferol (VITAMIN D3) 125 MCG (5000 UT) CAPS Take 5,000 Units by mouth daily.   fluticasone (FLONASE) 50 MCG/ACT nasal spray Place 1 spray into both nostrils daily.   gabapentin (NEURONTIN) 300 MG capsule Take 600 mg by mouth at bedtime.   hydrochlorothiazide (HYDRODIURIL) 25 MG tablet Take 12.5 mg by mouth daily.   levothyroxine (SYNTHROID) 100 MCG tablet Take 100 mcg by mouth daily before breakfast.   lisinopril (ZESTRIL) 10 MG tablet Take 10 mg by mouth daily.   Magnesium  400 MG TABS Take 400 mg by mouth daily.   mirabegron ER (MYRBETRIQ) 50 MG TB24 tablet Take 50 mg by mouth daily.   Multiple Vitamins-Minerals (ZINC PO) Take 50 mg by mouth daily.   niacin 500 MG tablet Take 500 mg by mouth daily.   pantoprazole (PROTONIX) 40 MG tablet Take 40 mg by mouth daily.   polyvinyl alcohol (LIQUIFILM TEARS) 1.4 % ophthalmic solution Place 1 drop into both eyes daily as needed for dry eyes.   POTASSIUM PO Take 595 mg by mouth daily.     Allergies:   Patient has no known allergies.   Social History   Socioeconomic History   Marital status: Widowed    Spouse name: Not on file   Number of children: 6   Years of education: Not on file   Highest education level: Not on file  Occupational History   Occupation: Agricultural engineer, drove a school bus, Systems analyst  Tobacco Use   Smoking status: Never   Smokeless tobacco: Never  Vaping Use   Vaping Use: Never used  Substance and Sexual Activity   Alcohol use: Never   Drug use: Never   Sexual activity: Not on file  Other Topics Concern   Not on file  Social History Narrative   Not on file   Social Determinants of Health   Financial Resource Strain: Not on file  Food Insecurity: Not on file  Transportation Needs: Not on file  Physical Activity: Not on file  Stress: Not on file  Social Connections: Not on file     Family History: The patient's family history includes Cancer in her father; Diabetes in her brother.  ROS:   Please see the history of present illness.    All other systems reviewed and are negative.  EKGs/Labs/Other Studies Reviewed:    The following studies were reviewed today:  Echocardiogram 10/20/21:   1. Left ventricular ejection fraction, by estimation, is 50 to 55%. The  left ventricle has low normal function. The left ventricle has no regional  wall motion abnormalities. There is mild left ventricular hypertrophy.  Left ventricular diastolic  parameters are consistent with Grade  II diastolic dysfunction  (pseudonormalization). Elevated left atrial pressure.   2. Right ventricular systolic function is normal. The right ventricular  size is normal. There is mildly elevated pulmonary artery systolic  pressure. The estimated right ventricular systolic pressure is [id number] mmHg.   3. Left atrial size was severely dilated.   4. Right atrial size was mildly dilated.   5. The mitral valve is degenerative. Mild mitral valve regurgitation.  Mild mitral stenosis. MG 66mmHg at 69bpm, MVA 1.8 cm^2 by continuity  equation   6. Tricuspid valve regurgitation is mild to moderate.   7. The inferior vena cava is normal in size with greater than 50%  respiratory variability, suggesting right atrial pressure of 3 mmHg.   8. There is a 26 mm Edwards Sapien prosthetic, stented (TAVR) valve  present in the aortic position.      Aortic valve regurgitation is not visualized. Vmax 2.7 m/s, MG 75mmHg,  AT 180ms, EOA 1.4 cm^2, DI 0.4   Aortic Valve: The aortic valve has been repaired/replaced. Aortic valve  regurgitation is not visualized. Aortic valve mean gradient measures 15.0  mmHg. Aortic valve peak gradient measures 29.3 mmHg. Aortic valve area, by  VTI measures 1.50 cm. There is a   26 mm Edwards Sapien prosthetic, stented (TAVR) valve present in the  aortic position.  TAVR OPERATIVE NOTE     Date of Procedure:                09/16/2021   Preoperative Diagnosis:      Severe Aortic Stenosis    Postoperative Diagnosis:    Same    Procedure:        Transcatheter Aortic Valve Replacement - Percutaneous Right Transfemoral Approach             Edwards Sapien 3 Ultra THV (size 26 mm, model # 9755RSL, serial # [id number])              Co-Surgeons:                        Gaye Pollack, MD, Lauree Chandler, MD (proctoring physician), and Lenna Sciara, MD   Anesthesiologist:                  Hoy Morn, MD   Echocardiographer:              Eleonore Chiquito, MD   Pre-operative  Echo Findings: Moderate to severe aortic stenosis Mild left ventricular systolic function   Post-operative Echo Findings: No paravalvular leak Unchanged left ventricular systolic function _____________   Echo 09/17/21: 26 mm S3 in aortic position. Vmax 2.17 m/s, MG 10 mmHG, EOA 2.39 cm2, DI 0.57. No regurgitation or paravalvular leak. Normal prosthesis. The aortic valve has been repaired/replaced. Aortic valve regurgitation is not visualized. There is a 26 mm  Edwards Sapien prosthetic (TAVR) valve present in the aortic position. Procedure Date: 09/16/2021.  _____________________  Echo 08/14/22 IMPRESSIONS  1. Left ventricular ejection fraction, by estimation, is 55 to 60%. The left ventricle has normal function. The left ventricle has no regional wall motion abnormalities. The left ventricular internal cavity size was mildly dilated. Left ventricular  diastolic parameters are consistent with Grade II diastolic dysfunction (pseudonormalization).  2. Right ventricular systolic function is normal. The right ventricular size is normal. There is mildly elevated pulmonary artery systolic pressure. The estimated right ventricular systolic pressure is 10.2 mmHg.  3. Left atrial size was severely dilated.  4. The mitral valve is degenerative. Mild mitral valve regurgitation. No evidence of mitral stenosis.  5. The aortic valve has been repaired/replaced. Aortic valve regurgitation is not visualized. There is a 26 mm Sapien prosthetic (TAVR) valve present in the aortic position. Procedure Date: 09/16/2021. Echo findings are consistent with normal structure  and function of the aortic valve prosthesis. Aortic valve area, by VTI measures 1.64 cm. Aortic valve mean gradient measures 10.8 mmHg. Aortic valve Vmax measures 2.18 m/s.  6. The inferior vena cava is normal in size with greater than 50% respiratory variability, suggesting right atrial pressure of 3 mmHg.   Comparison(s): No significant change  from prior study.  Conclusion(s)/Recommendation(s): Stable TAVR   EKG:  EKG is not ordered today.    Recent Labs: 09/12/2021: ALT 14 09/17/2021: BUN 17; Creatinine, Ser 0.82; Hemoglobin 9.0; Platelets 245; Potassium 4.3; Sodium 138  Recent Lipid Panel No results found for: "CHOL", "TRIG", "HDL", "CHOLHDL", "VLDL", "LDLCALC", "LDLDIRECT"  Physical Exam:    VS:  BP 120/72   Pulse 65   Ht 5\' 1"  (1.549 m)   Wt 213 lb (96.6 kg)   SpO2 97%   BMI 40.25 kg/m     Wt Readings from Last 3 Encounters:  08/14/22 213 lb (96.6 kg)  02/18/22 214 lb 12.8 oz (97.4 kg)  10/20/21 210 lb 6.4 oz (95.4 kg)    General: Well developed, well nourished, NAD Lungs:Clear to ausculation bilaterally. No wheezes, rales, or rhonchi. Breathing is unlabored. Cardiovascular: RRR with S1 S2. Soft flow murmur Extremities: No edema. Neuro: Alert and oriented. No focal deficits. No facial asymmetry. MAE spontaneously. Psych: Responds to questions appropriately with normal affect.    ASSESSMENT/PLAN:    Severe AS s/p TAVR: echo today shows EF 55%, normally functioning TAVR with a mean gradient of 10.8 mm hg and no PVL. She has NYHA class I symptoms.  SBE prophylaxis discussed; she has RX'd amoxicillin. Continue on aspirin alone.   HTN: Bp well controlled today. No changes made   GERD: Continue PTA Protonix.   LE edema: much improved on HCTZ. Being managed by PCP  Medication Adjustments/Labs and Tests Ordered: Current medicines are reviewed at length with the patient today.  Concerns regarding medicines are outlined above.  No orders of the defined types were placed in this encounter.   No orders of the defined types were placed in this encounter.    Patient Instructions  Medication Instructions:  Your physician recommends that you continue on your current medications as directed. Please refer to the Current Medication list given to you today.  *If you need a refill on your cardiac medications before  your next appointment, please call your pharmacy*  Lab Work: If you have labs (blood work) drawn today and your tests are completely normal, you will receive your results only by: [place] (if you have MyChart) OR A paper copy in the mail If you have any lab test that is abnormal or we need to change your treatment, we will call you to review the results.  Testing/Procedures: None ordered today .  Follow-Up: At [facility], you and your health needs are our priority.  As part of our continuing mission to provide you with exceptional heart care, we have created designated Provider Care Teams.  These Care Teams include your primary Cardiologist (physician) and Advanced Practice Providers (APPs -  Physician Assistants and Nurse Practitioners) who all work together to provide you with the care you need, when you need it.  We recommend signing up for the patient portal called "MyChart".  Sign up information is provided on this After Visit Summary.  MyChart is used to connect with patients for Virtual Visits (Telemedicine).  Patients are able to view lab/test results, encounter notes, upcoming appointments, etc.  Non-urgent messages can be sent to your provider as well.   To learn more about what you can do with MyChart, go to [email or web address].    Your next appointment:   6 month(s)  The format for your next appointment:   In Person  Provider:   Quay Burow, MD     Important Information About Sugar  Signed, Angelena Form, PA-C  08/18/2022 3:18 PM    [facility]

## 2022-08-18 LAB — ECHOCARDIOGRAM COMPLETE
AR max vel: 1.56 cm2
AV Area VTI: 1.64 cm2
AV Area mean vel: 1.57 cm2
AV Mean grad: 10.8 mmHg
AV Peak grad: 19 mmHg
Ao pk vel: 2.18 m/s
Area-P 1/2: 2.97 cm2
S' Lateral: 3.5 cm

## 2022-08-27 ENCOUNTER — Other Ambulatory Visit: Payer: Self-pay

## 2022-08-27 MED ORDER — AMOXICILLIN 500 MG PO TABS: 500.0000 mg | ORAL_TABLET | ORAL | 3 refills | Status: DC

## 2022-08-27 NOTE — Telephone Encounter (Signed)
Pt's medication was sent to pt's pharmacy as requested. Confirmation received.  °

## 2023-01-14 ENCOUNTER — Telehealth: Payer: Self-pay | Admitting: Cardiovascular Disease

## 2023-01-14 NOTE — Telephone Encounter (Signed)
Daughter stated patient was recently hospitalized and is supposed to be released today.  Daughter is concerned that patient's medications are being stopped and would like advice on next steps.

## 2023-01-14 NOTE — Telephone Encounter (Signed)
She is concerned about the provider stopping medications. Saying to stop aspirin and lisinopril. She is currently at [facility]. She has told them multiple times about the pt's TAVR and they seem to not remember. When she asked them about medications they get mad at her. They are being discharged today.   The patient has had a blood transfusion and iron infusion. She has a GI bleed- had low BP's as well. They told pt to stop taking the aspirin and the lisinopril at this time. The daughter is concerned about stopping these meds. I explained that the aspirin can make bleeding worse and that is why they stopped it and if her BP's have been too low- then discontinuing the BP med would keep it from going down lower.  They are stopping these meds now, but it does not mean they will be "discontinued forever," it may just be temporary. Told her to make an appointment with her PCP for a follow up visit within the next few days. She has an appointment with Dr Gwenlyn Found on 02/05/23. I told her that I would send this information to her provider and if they feel the pt should be seen sooner, then we will contact them.  Told her to call with any questions or concerns

## 2023-01-25 DIAGNOSIS — K269 Duodenal ulcer, unspecified as acute or chronic, without hemorrhage or perforation: Secondary | ICD-10-CM | POA: Insufficient documentation

## 2023-01-25 DIAGNOSIS — G4733 Obstructive sleep apnea (adult) (pediatric): Secondary | ICD-10-CM | POA: Insufficient documentation

## 2023-01-25 DIAGNOSIS — Z8719 Personal history of other diseases of the digestive system: Secondary | ICD-10-CM | POA: Insufficient documentation

## 2023-01-25 DIAGNOSIS — R7303 Prediabetes: Secondary | ICD-10-CM | POA: Insufficient documentation

## 2023-01-25 DIAGNOSIS — K259 Gastric ulcer, unspecified as acute or chronic, without hemorrhage or perforation: Secondary | ICD-10-CM | POA: Insufficient documentation

## 2023-01-25 DIAGNOSIS — E785 Hyperlipidemia, unspecified: Secondary | ICD-10-CM | POA: Insufficient documentation

## 2023-01-25 DIAGNOSIS — I509 Heart failure, unspecified: Secondary | ICD-10-CM | POA: Insufficient documentation

## 2023-01-25 DIAGNOSIS — Z87898 Personal history of other specified conditions: Secondary | ICD-10-CM | POA: Insufficient documentation

## 2023-01-25 DIAGNOSIS — I700 Atherosclerosis of aorta: Secondary | ICD-10-CM | POA: Insufficient documentation

## 2023-02-05 ENCOUNTER — Encounter: Payer: Self-pay | Admitting: Cardiovascular Disease

## 2023-02-05 ENCOUNTER — Ambulatory Visit: Payer: Medicare HMO | Attending: Cardiovascular Disease | Admitting: Cardiovascular Disease

## 2023-02-05 VITALS — BP 132/74 | HR 69 | Ht 61.0 in | Wt 211.2 lb

## 2023-02-05 DIAGNOSIS — I10 Essential (primary) hypertension: Secondary | ICD-10-CM

## 2023-02-05 DIAGNOSIS — I350 Nonrheumatic aortic (valve) stenosis: Secondary | ICD-10-CM | POA: Diagnosis not present

## 2023-02-05 NOTE — Assessment & Plan Note (Signed)
History of essential hypertension blood pressure measured today at 132/74.  She is on lisinopril.

## 2023-02-05 NOTE — Assessment & Plan Note (Signed)
History of severe aortic stenosis status post TAVR by Drs. Mcalhany,Thukkani and Bartle 09/16/2021 with a SAPIEN 3 ultra Resilia 26 bioprosthetic valve.  Her most recent 2D echo performed 08/12/2022 revealed normal LV systolic function with a well-functioning aortic bioprosthesis.  She is asymptomatic and followed in the structural clinic as well.

## 2023-02-05 NOTE — Progress Notes (Signed)
02/05/2023 Nicole Gates   [birth year]  [id number]:[id number]  [place], [facility] Primary Cardiologist: Nicole Harp MD Nicole Gates, [place], [place]  HPI:  Nicole Gates is a 82 y.o.  moderately overweight widowed Caucasian female mother of 60, grandmother of 31 grandchildren who is accompanied by one of her daughters Nicole Gates today.  She was referred by Dr. Claudie Gates, her cardiologist, for right left heart cath because of symptomatic severe aortic stenosis.  I last saw her in the office 02/18/2022.Marland Kitchen  She worked in [employer], was a Recruitment consultant and works in [employer] in her younger years.  She has no cardiac risk factors other than treated hypertension.  She never smoked.  Her brother did die of a myocardial infarction.  She is never had a heart attack or stroke.  She denies chest pain but has had progressive dyspnea.  She also has obstructive sleep apnea on CPAP.  She had a 2D echo performed by Dr. Claudie Gates revealing normal LV systolic function with severe aortic stenosis.  Valve area was 0.6 cm with a peak gradient of 57 mmHg.  A Myoview stress test was normal as well.  She has noticed increasing dyspnea on exertion when doing normal activity.  She underwent right left heart cath by myself 06/09/2021 revealing normal coronary arteries and severe aortic stenosis.  I referred her to the structural heart clinic and she ultimately underwent TAVR by Drs. Nicole Gates and Nicole Gates on 09/16/2021 with an Nicole Gates SAPIEN 3 ultra THV 26 mm bioprosthetic valve.  She was discharged home the following day.  She is clinically improved.  Her most recent follow-up 2D echocardiogram performed 08/14/2022 revealed normal LV systolic function with a well-functioning aortic bioprosthesis.  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  Since I saw her a year ago she remained stable.  She is completely asymptomatic.  She did have  admission last month for GI bleed and required transfusion of 1 unit of packed red blood cells.  Her hemoglobin came back up to the 9 range.  Aspirin was discontinued at that time.   Current Meds  Medication Sig   amoxicillin (AMOXIL) 500 MG tablet Take 1 tablet (500 mg total) by mouth as directed. Take 4 tablets by mouth 1 hour prior to dental procedures and cleanings   Ascorbic Acid (VITAMIN C PO) Take 500 mg by mouth daily.   aspirin 81 MG chewable tablet Chew 1 tablet (81 mg total) by mouth daily.   calcium carbonate (OS-CAL) 1250 (500 Ca) MG chewable tablet Chew 1 tablet by mouth daily.   Cholecalciferol (VITAMIN D3) 125 MCG (5000 UT) CAPS Take 5,000 Units by mouth daily.   ferrous sulfate 325 (65 FE) MG tablet Take 1 tablet by mouth daily with breakfast.   fluticasone (FLONASE) 50 MCG/ACT nasal spray Place 1 spray into both nostrils daily.   gabapentin (NEURONTIN) 300 MG capsule Take 600 mg by mouth at bedtime.   levothyroxine (SYNTHROID) 100 MCG tablet Take 100 mcg by mouth daily before breakfast.   lisinopril (ZESTRIL) 10 MG tablet Take 10 mg by mouth daily.   Magnesium 400 MG TABS Take 400 mg by mouth daily.   mirabegron ER (MYRBETRIQ) 50 MG TB24 tablet Take 50 mg by mouth daily.   Multiple Vitamins-Minerals (ZINC PO) Take 50 mg by mouth daily.   niacin 500 MG tablet Take 500 mg by mouth daily.   pantoprazole (PROTONIX) 40  MG tablet Take 40 mg by mouth daily.   polyvinyl alcohol (LIQUIFILM TEARS) 1.4 % ophthalmic solution Place 1 drop into both eyes daily as needed for dry eyes.   POTASSIUM PO Take 595 mg by mouth daily.     No Known Allergies  Social History   Socioeconomic History   Marital status: Widowed    Spouse name: Not on file   Number of children: 6   Years of education: Not on file   Highest education level: Not on file  Occupational History   Occupation: Agricultural engineer, drove a school bus, Systems analyst  Tobacco Use   Smoking status: Never   Smokeless tobacco:  Never  Vaping Use   Vaping Use: Never used  Substance and Sexual Activity   Alcohol use: Never   Drug use: Never   Sexual activity: Not on file  Other Topics Concern   Not on file  Social History Narrative   Not on file   Social Determinants of Health   Financial Resource Strain: Not on file  Food Insecurity: Not on file  Transportation Needs: Not on file  Physical Activity: Not on file  Stress: Not on file  Social Connections: Not on file  Intimate Partner Violence: Not on file     Review of Systems: General: negative for chills, fever, night sweats or weight changes.  Cardiovascular: negative for chest pain, dyspnea on exertion, edema, orthopnea, palpitations, paroxysmal nocturnal dyspnea or shortness of breath Dermatological: negative for rash Respiratory: negative for cough or wheezing Urologic: negative for hematuria Abdominal: negative for nausea, vomiting, diarrhea, bright red blood per rectum, melena, or hematemesis Neurologic: negative for visual changes, syncope, or dizziness All other systems reviewed and are otherwise negative except as noted above.    Blood pressure 132/74, pulse 69, height 5\' 1"  (1.549 m), weight 211 lb 3.2 oz (95.8 kg), SpO2 96 %.  General appearance: alert and no distress Neck: no adenopathy, no carotid bruit, no JVD, supple, symmetrical, trachea midline, and thyroid not enlarged, symmetric, no tenderness/mass/nodules Lungs: clear to auscultation bilaterally Heart: Soft outflow tract murmur Extremities: extremities normal, atraumatic, no cyanosis or edema Pulses: 2+ and symmetric Skin: Skin color, texture, turgor normal. No rashes or lesions Neurologic: Grossly normal  EKG sinus rhythm at 69 with left bundle branch block unchanged from prior EKGs.  Personally reviewed this EKG.  ASSESSMENT AND PLAN:   Severe aortic stenosis History of severe aortic stenosis status post TAVR by Drs. Nicole Gates,Nicole Gates and Nicole Gates 09/16/2021 with a SAPIEN  3 ultra Resilia 26 bioprosthetic valve.  Her most recent 2D echo performed 08/12/2022 revealed normal LV systolic function with a well-functioning aortic bioprosthesis.  She is asymptomatic and followed in the structural clinic as well.  HTN (hypertension) History of essential hypertension blood pressure measured today at 132/74.  She is on lisinopril.     Nicole Harp MD FACP,FACC,FAHA, [facility] 02/05/2023 9:12 AM

## 2023-02-05 NOTE — Patient Instructions (Addendum)
Medication Instructions:  Your physician has recommended you make the following change in your medication:   -Restart aspirin EC 81mg  on April 1.  *If you need a refill on your cardiac medications before your next appointment, please call your pharmacy*   Lab Work: Your physician recommends that you return for lab work in: 2 weeks after starting aspirin for CBC   If you have labs (blood work) drawn today and your tests are completely normal, you will receive your results only by: [facility] [place] (if you have MyChart) OR A paper copy in the mail If you have any lab test that is abnormal or we need to change your treatment, we will call you to review the results.   Testing/Procedures: Your physician has requested that you have an echocardiogram. Echocardiography is a painless test that uses sound waves to create images of your heart. It provides your doctor with information about the size and shape of your heart and how well your heart's chambers and valves are working. This procedure takes approximately one hour. There are no restrictions for this procedure. Please do NOT wear cologne, perfume, aftershave, or lotions (deodorant is allowed). Please arrive 15 minutes prior to your appointment time. This procedure will be done at [address]. To be done in September.    Follow-Up: At [facility], you and your health needs are our priority.  As part of our continuing mission to provide you with exceptional heart care, we have created designated Provider Care Teams.  These Care Teams include your primary Cardiologist (physician) and Advanced Practice Providers (APPs -  Physician Assistants and Nurse Practitioners) who all work together to provide you with the care you need, when you need it.  We recommend signing up for the patient portal called "MyChart".  Sign up information is provided on this After Visit Summary.  MyChart is used to connect with patients for Virtual  Visits (Telemedicine).  Patients are able to view lab/test results, encounter notes, upcoming appointments, etc.  Non-urgent messages can be sent to your provider as well.   To learn more about what you can do with MyChart, go to [email or web address].    Your next appointment:   12 month(s)  Provider:   Quay Burow, MD

## 2023-03-12 LAB — CBC
Hematocrit: 37.6 % (ref 34.0–46.6)
Hemoglobin: 11.8 g/dL (ref 11.1–15.9)
MCH: 27.6 pg (ref 26.6–33.0)
MCHC: 31.4 g/dL — ABNORMAL LOW (ref 31.5–35.7)
MCV: 88 fL (ref 79–97)
Platelets: 268 10*3/uL (ref 150–450)
RBC: 4.27 x10E6/uL (ref 3.77–5.28)
RDW: 15.4 % (ref 11.7–15.4)
WBC: 7 10*3/uL (ref 3.4–10.8)

## 2023-08-11 ENCOUNTER — Ambulatory Visit (HOSPITAL_COMMUNITY): Payer: Medicare HMO | Attending: Internal Medicine

## 2023-08-11 DIAGNOSIS — I350 Nonrheumatic aortic (valve) stenosis: Secondary | ICD-10-CM | POA: Insufficient documentation

## 2023-08-11 DIAGNOSIS — I10 Essential (primary) hypertension: Secondary | ICD-10-CM | POA: Diagnosis not present

## 2023-08-11 LAB — ECHOCARDIOGRAM COMPLETE
AR max vel: 2.58 cm2
AV Area VTI: 2.77 cm2
AV Area mean vel: 2.46 cm2
AV Mean grad: 9.5 mmHg
AV Peak grad: 19.4 mmHg
Ao pk vel: 2.21 m/s
Area-P 1/2: 3.55 cm2
MV M vel: 4.91 m/s
MV Peak grad: 96.4 mmHg
S' Lateral: 3.4 cm

## 2023-08-12 IMAGING — CT CT CTA ABD/PEL W/CM AND/OR W/O CM
2 of 7 series · 16 of 36 positions shown · IV contrast (omnipaque)
Comparison: 09/09/2018 CT abdomen/pelvis.

CLINICAL DATA: Severe aortic stenosis. TAVR pre-intervention
planning.

EXAM:
CT ANGIOGRAPHY CHEST, ABDOMEN AND PELVIS
TECHNIQUE: Multidetector CT imaging through the chest, abdomen and pelvis was
performed using the standard protocol during bolus administration of
intravenous contrast. Multiplanar reconstructed images and MIPs were
obtained and reviewed to evaluate the vascular anatomy.
CONTRAST:  95mL OMNIPAQUE IOHEXOL 350 MG/ML SOLN

[Series 3: ax thins · axial · 0.59mm/px · z∈[+617,+1234]mm · 15 of 695 slices shown]
[im 39/695  lung]
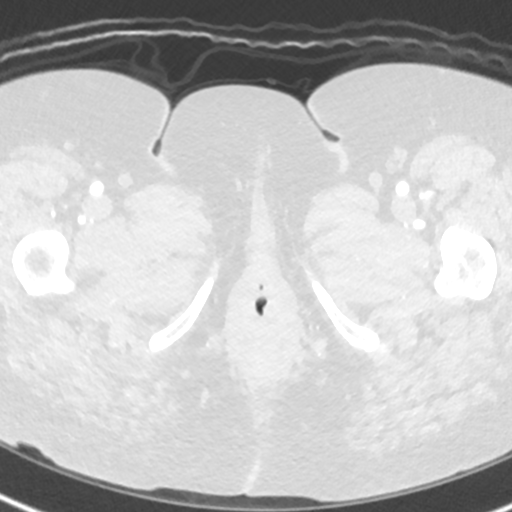
[im 78/695  mediastinal]
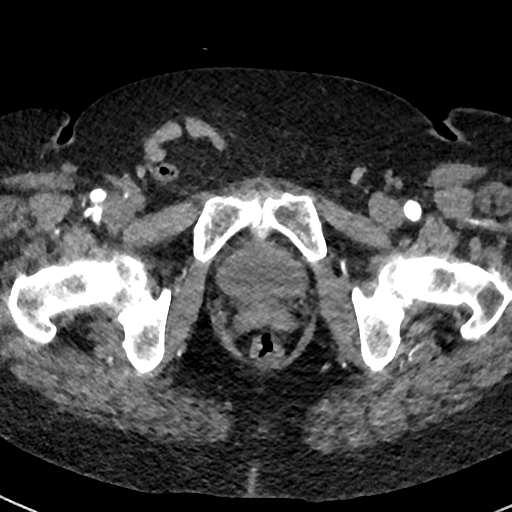
[im 116/695  lung]
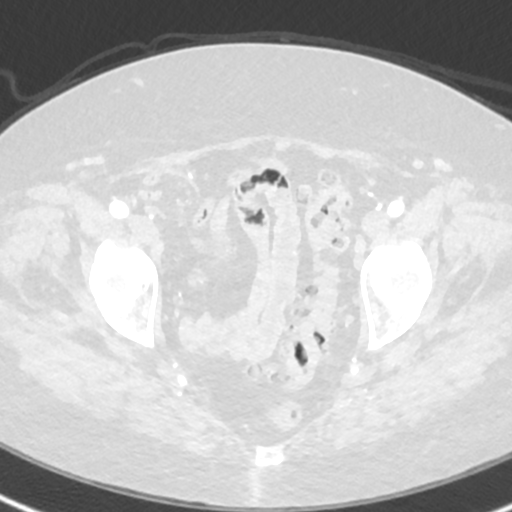
[im 155/695  mediastinal]
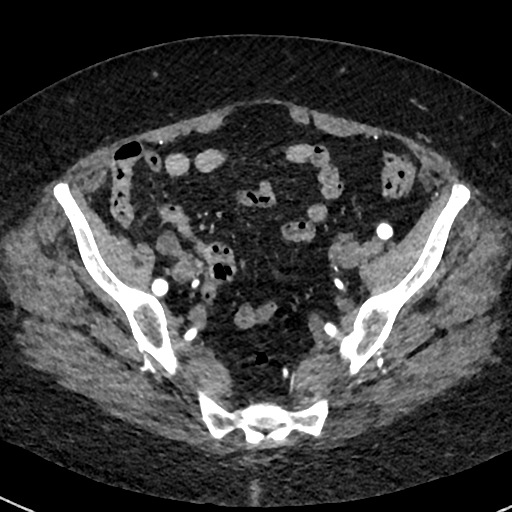
[im 232/695  lung]
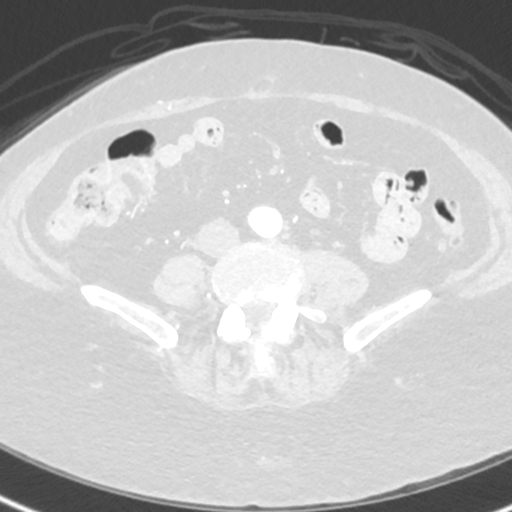
[im 270/695  mediastinal]
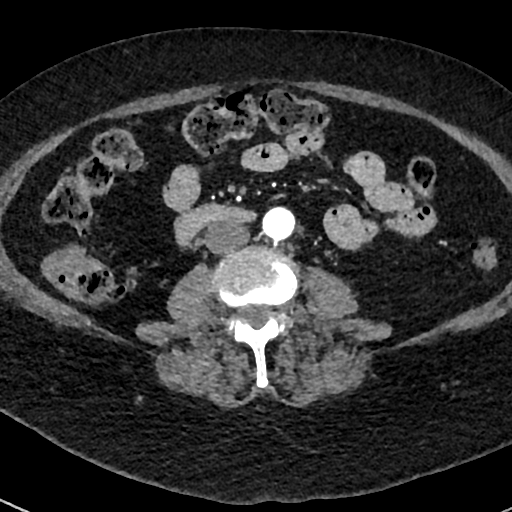
[im 309/695  lung]
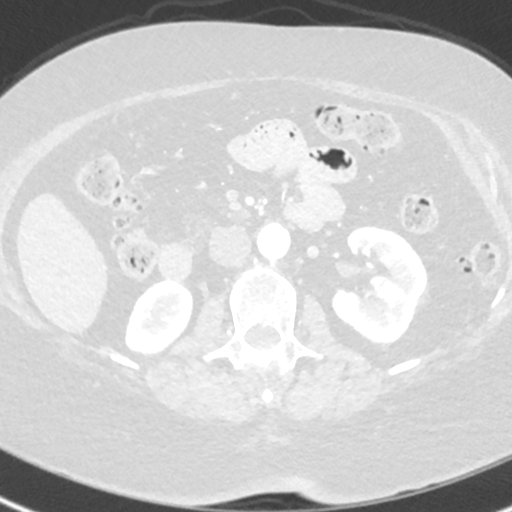
[im 348/695  mediastinal]
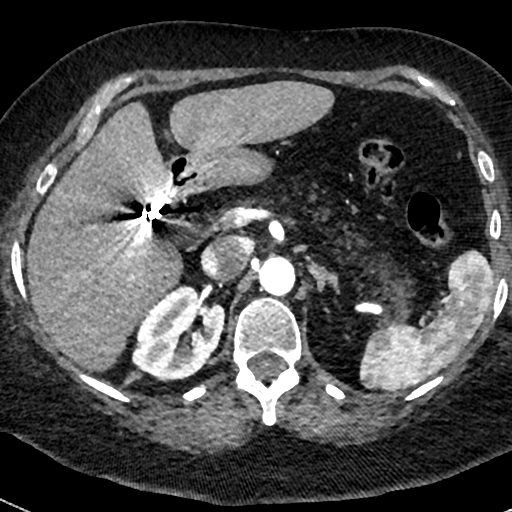
[im 386/695  lung]
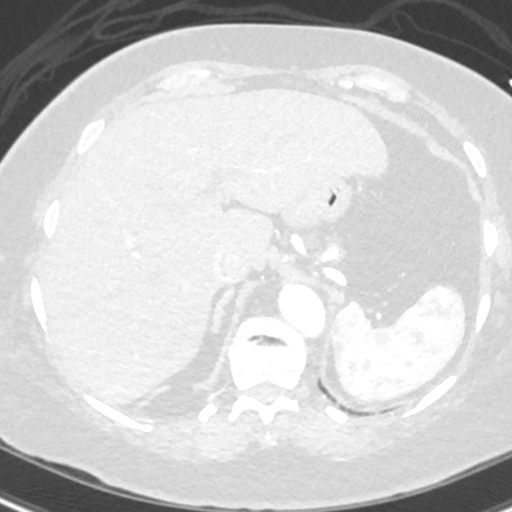
[im 425/695  mediastinal]
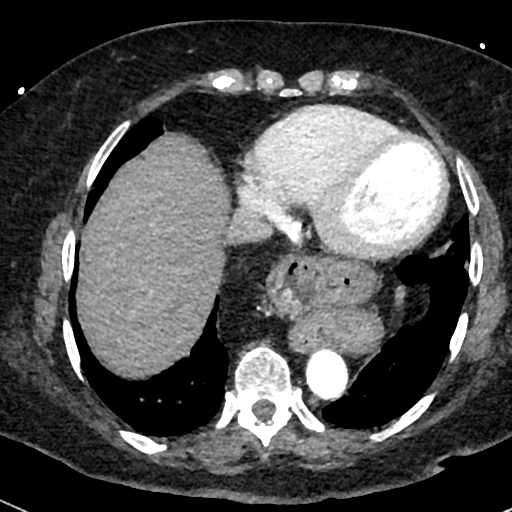
[im 463/695  lung]
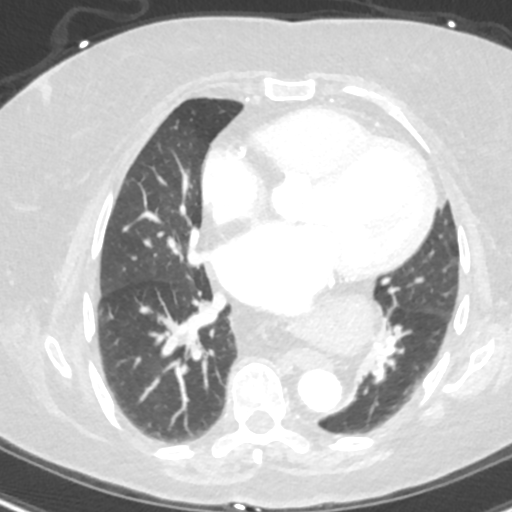
[im 540/695  mediastinal]
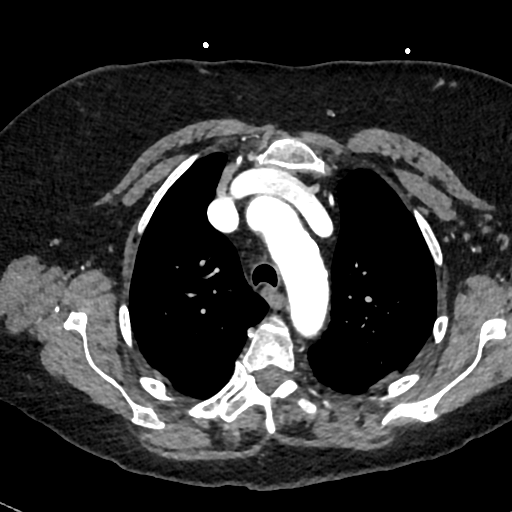
[im 579/695  lung]
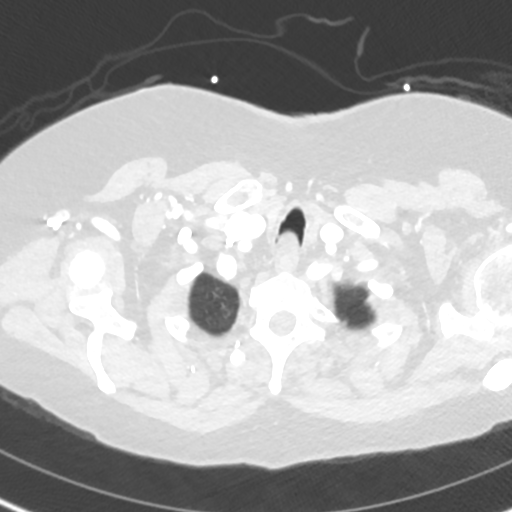
[im 617/695  mediastinal]
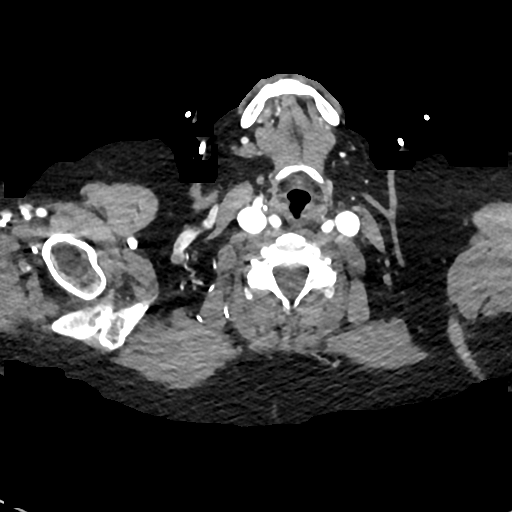
[im 656/695  lung]
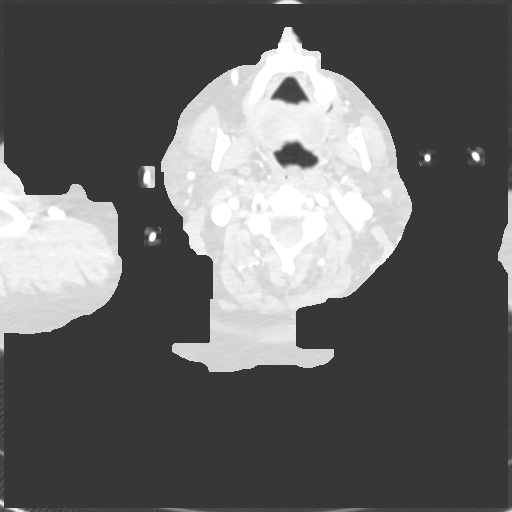

[Series 6: cor · coronal · 0.87mm/px · 1 of 127 slices shown]
[im 64/127  mediastinal]
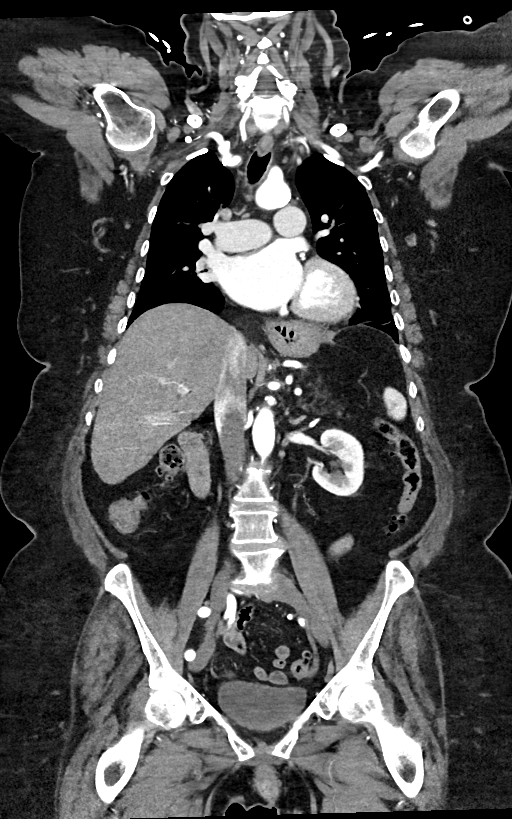

[16 of 36 positions shown; findings below may reference images not displayed]

FINDINGS: CTA CHEST FINDINGS

Cardiovascular: Mild cardiomegaly. No significant pericardial
effusion/thickening. Left anterior descending coronary
atherosclerosis. Diffuse thickening and calcification of the aortic
valve. Atherosclerotic nonaneurysmal thoracic aorta. Top-normal
caliber main pulmonary artery (3.4 cm diameter). No central
pulmonary emboli.

Mediastinum/Nodes: No discrete thyroid nodules. Unremarkable
esophagus. No pathologically enlarged axillary, mediastinal or hilar
lymph nodes.

Lungs/Pleura: No pneumothorax. No pleural effusion. Two scattered
tiny peripheral left upper lobe ground-glass nodules, largest 3 mm
(series 5/image 58). No acute consolidative airspace disease or lung
masses. Mild compressive atelectasis at the medial basilar left
lower lobe due to the hiatal hernia.

Musculoskeletal: No aggressive appearing focal osseous lesions. Mild
thoracic spondylosis.

CTA ABDOMEN AND PELVIS FINDINGS

Hepatobiliary: Normal liver with no liver mass. Cholecystectomy.
Bile ducts are within normal post cholecystectomy limits with CBD
diameter 9 mm.

Pancreas: Diffuse fatty atrophy, with no mass or duct dilation.

Spleen: Normal size. No mass.

Adrenals/Urinary Tract: Normal adrenals. No hydronephrosis. No
contour deforming renal masses. Normal bladder.

Stomach/Bowel: Large hiatal hernia. Stomach is nondistended and
otherwise normal. Moderate right inguinal hernia contains small
bowel loops. No small bowel dilatation, caliber transition, wall
thickening or pneumatosis. Normal appendix. Marked diffuse colonic
diverticulosis with no large bowel wall thickening or significant
pericolonic fat stranding.

Vascular/Lymphatic: Mildly atherosclerotic nonaneurysmal abdominal
aorta. No pathologically enlarged lymph nodes in the abdomen or
pelvis.

Reproductive: Status post hysterectomy, with no abnormal findings at
the vaginal cuff. Simple 1.7 cm right adnexal cyst (series 4/image
177). No left adnexal masses.

Other: No pneumoperitoneum, ascites or focal fluid collection.

Musculoskeletal: No aggressive appearing focal osseous lesions.
Moderate degenerative disc disease at L5-S1.

VASCULAR MEASUREMENTS PERTINENT TO TAVR:

AORTA:

Minimal Aortic Tiameter-CC.C x 17.1 mm

Severity of Aortic Calcification-mild-to-moderate

RIGHT PELVIS:

Right Common Iliac Artery -

Minimal 6iameter-PP.Z x 10.9 mm

Tortuosity-mild

Calcification-mild

Right External Iliac Artery -

Minimal Biameter-N.N x 8.5 mm

Tortuosity-moderate

Calcification-none

Right Common Femoral Artery -

Minimal 5iameter-K.M x 9.3 mm

Tortuosity-mild

Calcification-mild

LEFT PELVIS:

Left Common Iliac Artery -

Minimal 6iameter-PP.Z x 9.6 mm

Tortuosity-mild

Calcification-moderate

Left External Iliac Artery -

Minimal 5iameter-K.M x 8.6 mm

Tortuosity-mild

Calcification-none

Left Common Femoral Artery -

Minimal Jiameter-K.S x 8.2 mm

Tortuosity-mild

Calcification-mild

Review of the MIP images confirms the above findings.
IMPRESSION: 1. Vascular findings and measurements pertinent to potential TAVR
procedure, as detailed.
2. Diffuse thickening and calcification of the aortic valve,
compatible with the reported history of severe aortic stenosis.
3. Mild cardiomegaly. One vessel coronary atherosclerosis.
4. Large hiatal hernia.
5. Marked diffuse colonic diverticulosis.
6. Moderate right inguinal hernia contains small bowel loops. No
evidence of acute bowel complication.
7. Simple 1.7 cm right adnexal cyst. No follow-up imaging is
recommended. Reference: JACR [DATE]):[id number]
8. Two scattered tiny peripheral left upper lobe ground-glass
nodules, largest 3 mm. No follow-up required.
9. Aortic Atherosclerosis ([id number]-[id number]).

## 2023-09-18 IMAGING — CR DG CHEST 2V
2 series · 2 of 2 positions shown · non-contrast
Comparison: August 24, 2018.

CLINICAL DATA: Preop for transcatheter aortic valve repair.

EXAM:
CHEST - 2 VIEW

[w chest pa]
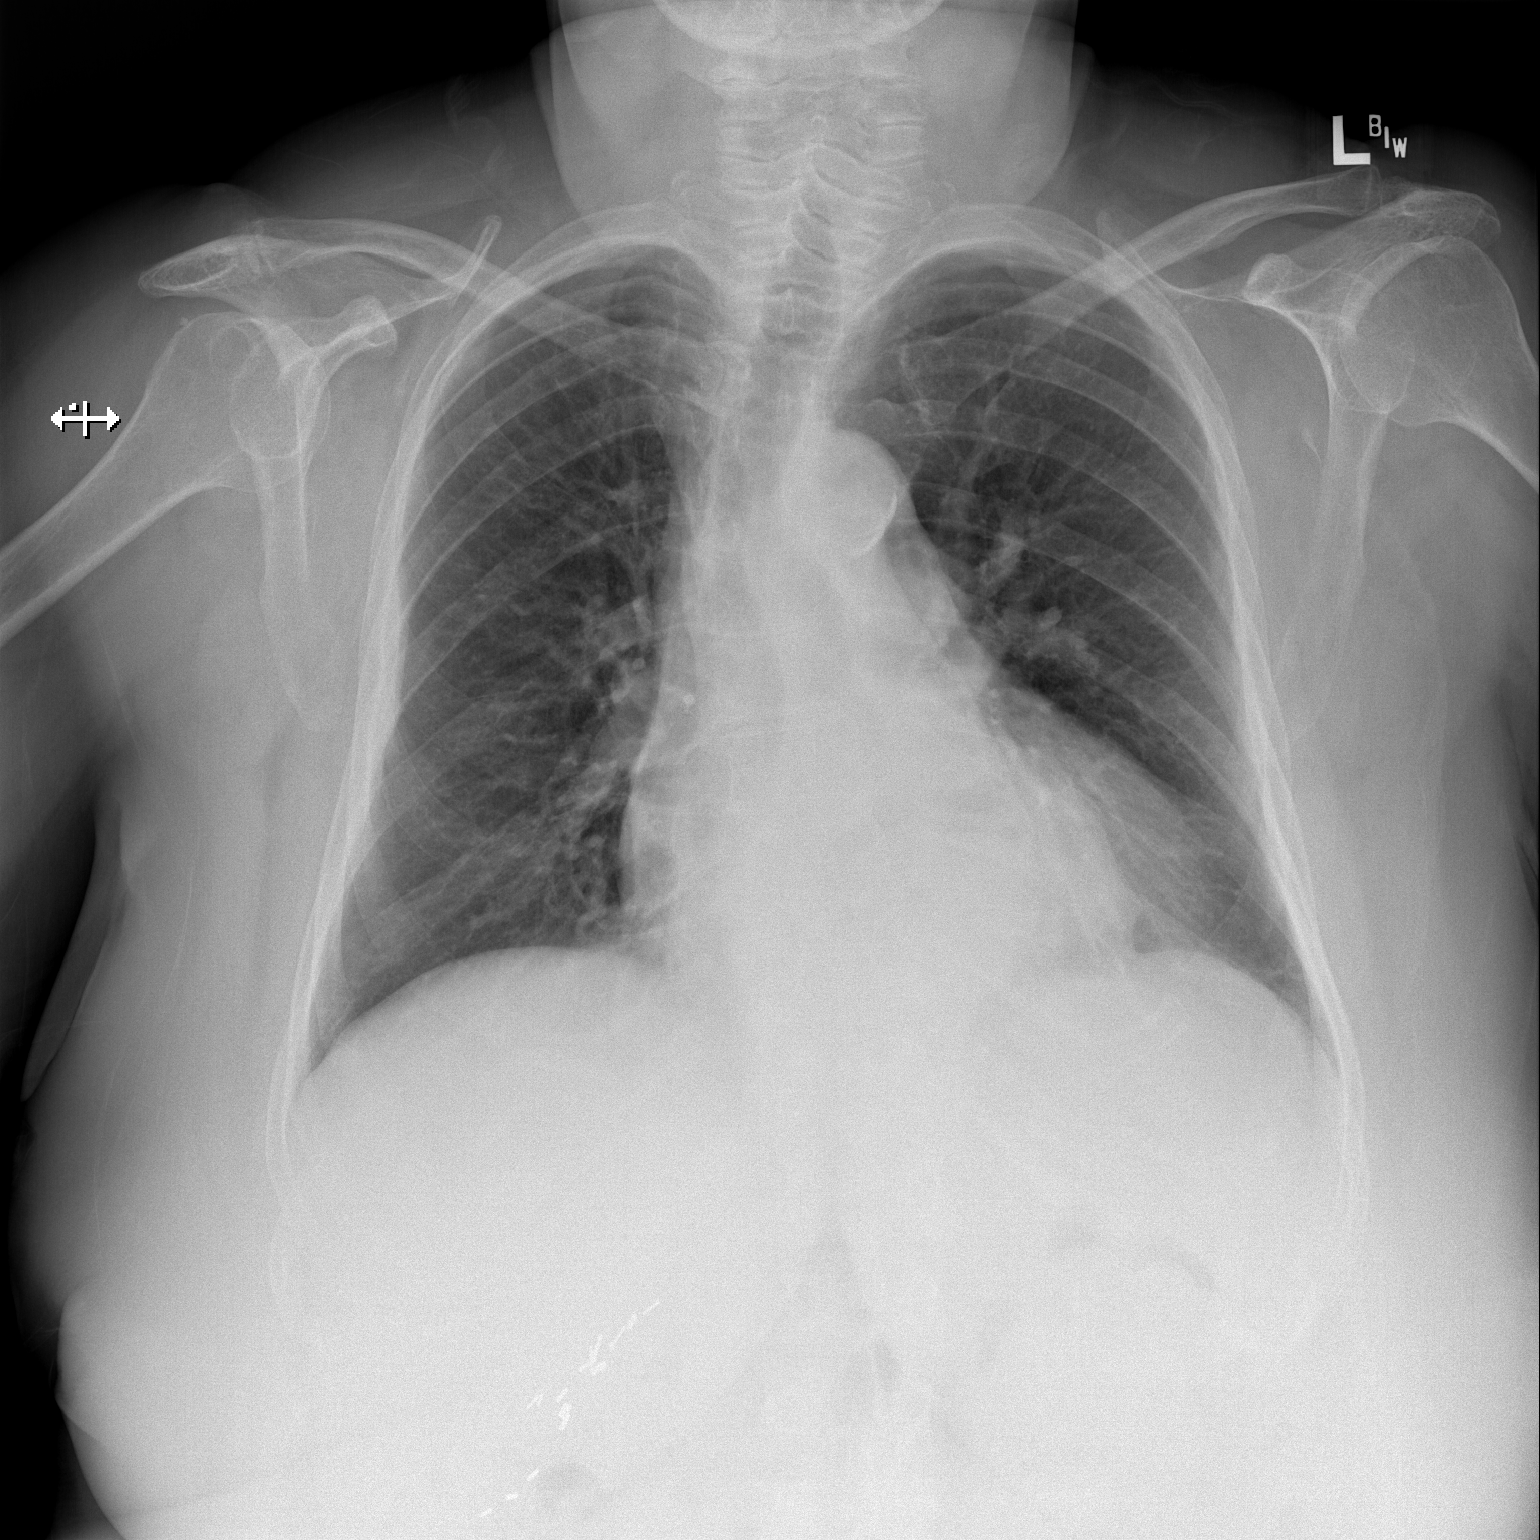

[w chest lat]
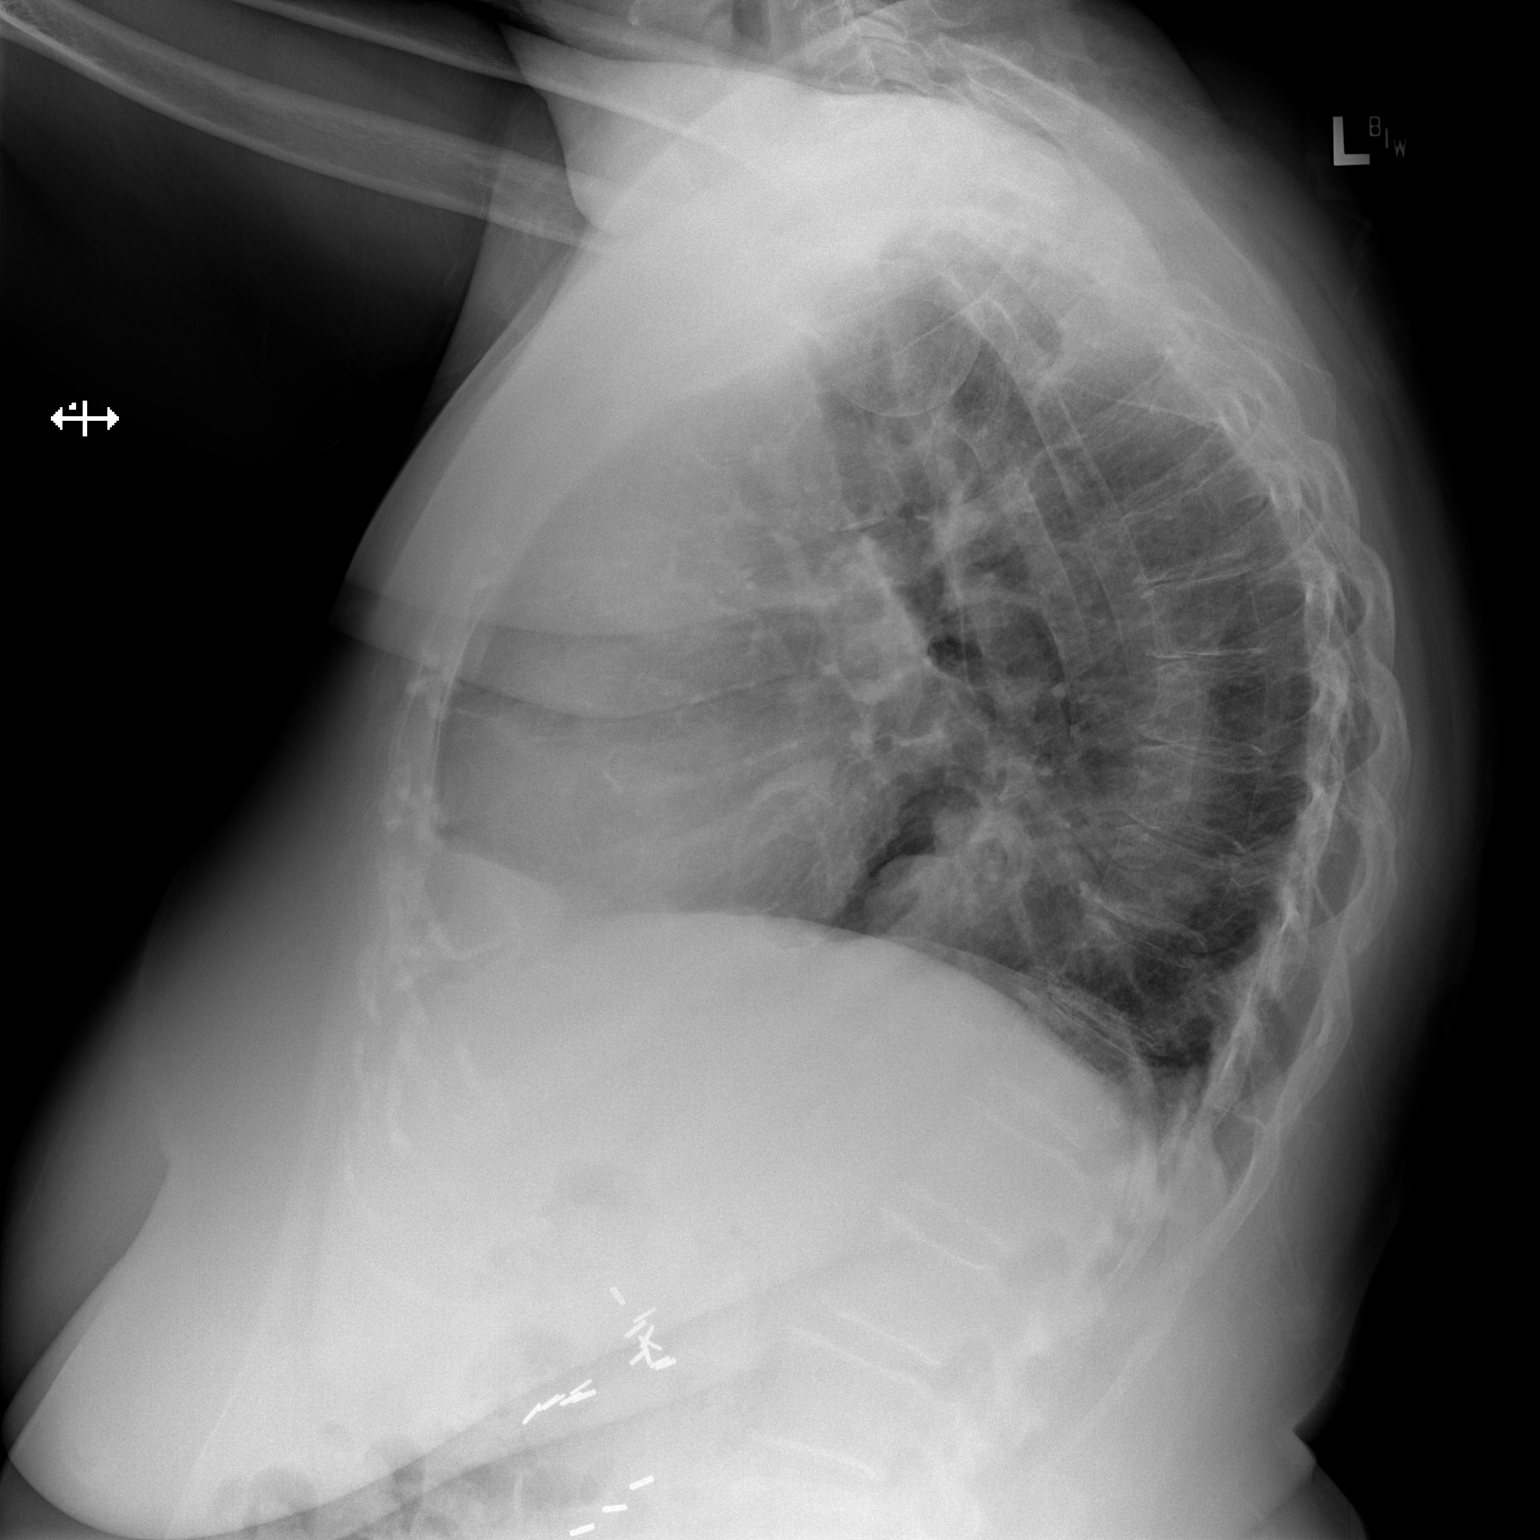

[2 of 2 positions shown; findings below may reference images not displayed]

FINDINGS: Stable cardiomegaly. Both lungs are clear. Probable small hiatal
hernia. The visualized skeletal structures are unremarkable.
IMPRESSION: No active cardiopulmonary disease.

## 2024-01-12 ENCOUNTER — Other Ambulatory Visit: Payer: Self-pay | Admitting: Cardiovascular Disease

## 2024-02-09 DIAGNOSIS — H57813 Brow ptosis, bilateral: Secondary | ICD-10-CM | POA: Insufficient documentation

## 2024-03-30 ENCOUNTER — Telehealth: Payer: Self-pay | Admitting: Neurology

## 2024-03-30 ENCOUNTER — Ambulatory Visit: Payer: Medicare HMO | Admitting: Neurology

## 2024-03-30 ENCOUNTER — Encounter: Payer: Self-pay | Admitting: Neurology

## 2024-03-30 VITALS — BP 136/88 | Ht 61.0 in | Wt 207.0 lb

## 2024-03-30 DIAGNOSIS — R269 Unspecified abnormalities of gait and mobility: Secondary | ICD-10-CM | POA: Insufficient documentation

## 2024-03-30 DIAGNOSIS — G20C Parkinsonism, unspecified: Secondary | ICD-10-CM | POA: Diagnosis not present

## 2024-03-30 MED ORDER — ALPRAZOLAM 0.5 MG PO TABS
ORAL_TABLET | ORAL | 0 refills | Status: AC
Start: 2024-03-30 — End: ?

## 2024-03-30 MED ORDER — CARBIDOPA-LEVODOPA 25-100 MG PO TABS: 1.0000 | ORAL_TABLET | Freq: Three times a day (TID) | ORAL | 11 refills | Status: DC

## 2024-03-30 NOTE — Patient Instructions (Addendum)
 Meds ordered this encounter  Medications   ALPRAZolam (XANAX) 0.5 MG tablet    Sig: Take 1-2 tablets 30 minutes prior to MRI, may repeat once as needed. Must have driver.    Dispense:  3 tablet    Refill:  0   carbidopa-levodopa (SINEMET IR) 25-100 MG tablet    Sig: Take 1 tablet by mouth 3 (three) times daily.    Dispense:  90 tablet    Refill:  11

## 2024-03-30 NOTE — Progress Notes (Signed)
 Chief Complaint  Patient presents with   New Patient (Initial Visit)    Rm 14, NP with daughter Nicole Gates, drooling in both hands ( more in left)  and drooling, started about 1 year ago      ASSESSMENT AND PLAN  Nicole Gates is a 83 y.o. 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  Reevaluation from January 2025, ferritin 22, LDL 122, normal TSH, creatinine 0.81 GFR of 73, normal CMP, CBC, hemoglobin of 12.4  MEDICAL HISTORY:  Nicole Gates, is a 83 year old female seen in request by [facility] Dr. Fulton Job, Joseph Nickel, for evaluation of bilateral hands tremor, drooling, gait abnormality, initial evaluation was on Mar 30, 2024  History is obtained from the patient and review of electronic medical records. I personally reviewed pertinent available imaging films in PACS.   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  PHYSICAL EXAMNIATION:  Gen: NAD, conversant, well nourised, well groomed                     Cardiovascular: Regular rate rhythm, no peripheral edema, warm, nontender. Eyes: Conjunctivae clear without exudates or hemorrhage Neck: Supple, no carotid bruits. Pulmonary: Clear to auscultation bilaterally   NEUROLOGICAL EXAM:  MENTAL STATUS: Speech/cognition: Obese, slow reaction time,  oriented to history taking and casual conversation CRANIAL NERVES: CN II: Visual fields are full to confrontation. Pupils are round equal and briskly reactive to light. CN III, IV, VI: extraocular movement are normal. No ptosis. CN V: Facial sensation is intact to light touch CN VII: Face is symmetric with normal eye closure  CN VIII: Hearing is normal to causal conversation. CN IX, X: Phonation is normal. CN XI: Head turning and shoulder shrug are intact  MOTOR: Mild left more than right resting tremor, mild left more than right rigidity, bradykinesia  REFLEXES: Reflexes are 2  and symmetric at the biceps, triceps, trace knees, and absent ankles. Plantar responses are flexor.  SENSORY: Length-dependent decreased light touch, pinprick, vibratory sensation to distal shin level  COORDINATION: There is no trunk or limb dysmetria noted.  GAIT/STANCE: Push-up to get up from seated position, decreased bilateral  arm swing, leaning forward, en bloc turning  REVIEW OF SYSTEMS:  Full 14 system review of systems performed and notable only for as above All other review of systems were negative.   ALLERGIES: No Known Allergies  HOME MEDICATIONS: Current Outpatient Medications  Medication Sig Dispense Refill   amoxicillin  (AMOXIL ) 500 MG capsule  TAKE 4 CAPSULES BY MOUTH 1 HOUR PRIOR TO DENTAL PROCEDURES AND CLEANINGS 4 capsule 0   Ascorbic Acid (VITAMIN C PO) Take 500 mg by mouth daily.     aspirin  81 MG chewable tablet Chew 1 tablet (81 mg total) by mouth daily.     calcium carbonate (OS-CAL) 1250 (500 Ca) MG chewable tablet Chew 1 tablet by mouth daily.     Cholecalciferol (VITAMIN D3) 125 MCG (5000 UT) CAPS Take 5,000 Units by mouth daily.     fluticasone (FLONASE) 50 MCG/ACT nasal spray Place 1 spray into both nostrils daily.     gabapentin (NEURONTIN) 300 MG capsule Take 900 mg by mouth at bedtime. Takes 300 mg in the am and 600mg  in the pm     levothyroxine  (SYNTHROID ) 100 MCG tablet Take 100 mcg by mouth daily before breakfast.     lisinopril (ZESTRIL) 10 MG tablet Take 10 mg by mouth daily.     Magnesium  400 MG TABS Take 400 mg by mouth daily.     Multiple Vitamins-Minerals (ZINC PO) Take 50 mg by mouth daily.     niacin 500 MG tablet Take 500 mg by mouth daily.     pantoprazole  (PROTONIX ) 40 MG tablet Take 40 mg by mouth 2 (two) times daily.     polyvinyl alcohol (LIQUIFILM TEARS) 1.4 % ophthalmic solution Place 1 drop into both eyes daily as needed for dry eyes.     POTASSIUM PO Take 595 mg by mouth daily.     fesoterodine (TOVIAZ) 4 MG TB24 tablet Take 8 mg by mouth daily.     mirabegron ER (MYRBETRIQ) 50 MG TB24 tablet Take 50 mg by mouth daily. (Patient not taking: Reported on 03/30/2024)     No current facility-administered medications for this visit.    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 Abel Hoe, Dr. Lorie Rook, Dr. Sherene Dilling   Severe aortic stenosis    Sleep apnea     PAST SURGICAL HISTORY: Past Surgical History:  Procedure Laterality Date   ABDOMINAL HYSTERECTOMY     CHOLECYSTECTOMY      RIGHT/LEFT HEART CATH AND CORONARY ANGIOGRAPHY N/A 06/09/2021   Procedure: RIGHT/LEFT HEART CATH AND CORONARY ANGIOGRAPHY;  Surgeon: Avanell Leigh, MD;  [facility]: [facility] INVASIVE CV LAB;  Service: Cardiovascular;  Laterality: N/A;   TEE WITHOUT CARDIOVERSION N/A 09/16/2021   Procedure: TRANSESOPHAGEAL ECHOCARDIOGRAM (TEE);  Surgeon: Odie Benne, MD;  Location: [facility] INVASIVE CV LAB;  Service: Open Heart Surgery;  Laterality: N/A;   TRANSCATHETER AORTIC VALVE REPLACEMENT, TRANSFEMORAL N/A 09/16/2021   Procedure: TRANSCATHETER AORTIC VALVE REPLACEMENT, TRANSFEMORAL;  Surgeon: Odie Benne, MD;  Location: [facility] INVASIVE CV LAB;  Service: Open Heart Surgery;  Laterality: N/A;    FAMILY HISTORY: Family History  Problem Relation Age of Onset   Cancer Father    Diabetes Brother     SOCIAL HISTORY: Social History   Socioeconomic History  Marital status: Widowed    Spouse name: Not on file   Number of children: 6   Years of education: Not on file   Highest education level: Not on file  Occupational History   Occupation: Futures trader, drove a school bus, Engineer, petroleum  Tobacco Use   Smoking status: Never   Smokeless tobacco: Never  Vaping Use   Vaping status: Never Used  Substance and Sexual Activity   Alcohol use: Never   Drug use: Never   Sexual activity: Not on file  Other Topics Concern   Not on file  Social History Narrative   Right handed   Caffeine  2 cups daily   Lives alone   Retired, cafeteria lady      Social Drivers of Corporate investment banker Strain: Not on File (11/10/2022)   Received from [facility]    Financial Resource Strain: 0  Food Insecurity: Not on File (11/10/2022)   Received from [facility] Insecurity    Food: 0  Transportation Needs: Not on File (11/10/2022)   Received from [facility] [employer] Needs    Transportation: 0  Physical Activity: Not on File (11/10/2022)   Received from [facility]    Physical Activity    Physical Activity: 0  Stress: Not on File (11/10/2022)   Received from [facility]   Stress    Stress: 0  Social Connections: Not on File (11/10/2022)   Received from [facility] [facility]   Social Connections    Connectedness: 0  Intimate Partner Violence: Not on file      Phebe Brasil, M.D. Ph.D.  [facility] [address] [place], [place] [address] Ph: [phone] Fax: [phone]  CC:  Valerie Gather, MD [address]  [address] [place],  [place] [address]  Valerie Gather, MD

## 2024-03-30 NOTE — Telephone Encounter (Signed)
 [facility] is going to take this patient.

## 2024-04-03 ENCOUNTER — Telehealth: Payer: Self-pay | Admitting: Neurology

## 2024-04-03 NOTE — Telephone Encounter (Signed)
 Called and spoke to garcia and stated that we are agreeable to vo

## 2024-04-03 NOTE — Telephone Encounter (Signed)
 PT Nicole Gates w/ [facility] has called for verbal orders for 1 week 2, 2 week 2 and 1 week 5  effective May 9th

## 2024-04-04 ENCOUNTER — Telehealth: Payer: Self-pay | Admitting: Neurology

## 2024-04-04 NOTE — Telephone Encounter (Signed)
 Elihu [employer]um[employer] Sie[employer]ried [employer]: [id number] exp. 04/04/24-06/03/24 sent to [facility] [phone]

## 2024-05-09 ENCOUNTER — Other Ambulatory Visit: Payer: Self-pay | Admitting: Cardiovascular Disease

## 2024-05-13 ENCOUNTER — Other Ambulatory Visit

## 2024-05-13 ENCOUNTER — Ambulatory Visit
Admission: RE | Admit: 2024-05-13 | Discharge: 2024-05-13 | Disposition: A | Discharge status: Home / Self Care | Source: Ambulatory Visit | Attending: Neurology | Admitting: Neurology

## 2024-05-13 DIAGNOSIS — G20C Parkinsonism, unspecified: Secondary | ICD-10-CM | POA: Diagnosis not present

## 2024-05-13 DIAGNOSIS — R269 Unspecified abnormalities of gait and mobility: Secondary | ICD-10-CM | POA: Diagnosis not present

## 2024-05-15 ENCOUNTER — Ambulatory Visit: Payer: Self-pay | Admitting: Neurology

## 2024-05-31 ENCOUNTER — Other Ambulatory Visit: Payer: Self-pay | Admitting: Cardiovascular Disease

## 2024-06-29 ENCOUNTER — Telehealth: Payer: Self-pay

## 2024-06-29 NOTE — Telephone Encounter (Signed)
 Home health form faxed to [facility]

## 2024-07-11 ENCOUNTER — Encounter: Payer: Self-pay | Admitting: Neurology

## 2024-07-11 ENCOUNTER — Ambulatory Visit (INDEPENDENT_AMBULATORY_CARE_PROVIDER_SITE_OTHER): Admitting: Neurology

## 2024-07-11 VITALS — BP 134/79 | HR 66 | Resp 15 | Ht 61.0 in

## 2024-07-11 DIAGNOSIS — R269 Unspecified abnormalities of gait and mobility: Secondary | ICD-10-CM | POA: Diagnosis not present

## 2024-07-11 DIAGNOSIS — I679 Cerebrovascular disease, unspecified: Secondary | ICD-10-CM | POA: Diagnosis not present

## 2024-07-11 DIAGNOSIS — G20C Parkinsonism, unspecified: Secondary | ICD-10-CM

## 2024-07-11 MED ORDER — CARBIDOPA-LEVODOPA 25-100 MG PO TABS: 1.0000 | ORAL_TABLET | Freq: Three times a day (TID) | ORAL | 3 refills | Status: AC

## 2024-07-11 NOTE — Progress Notes (Signed)
 Chief Complaint  Patient presents with   Tremors    Rm14, daughter presesnt(Heather), pt and daughter stated that the tremors have drastically improved. They seemed to be more noticeable or triggered by emotion, stress   Gait Problem    Rm14, daughter (heather) present, daughter stated that the gait has gotten better and no recent falls, pt uses walking stick to help w/mobility       ASSESSMENT AND PLAN  Nicole Gates is a 83 y.o. 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  Reevaluation from January 2025, ferritin 22, LDL 122, normal TSH, creatinine 0.81 GFR of 73, normal CMP, CBC, hemoglobin of 12.4  MEDICAL HISTORY:  Nicole Gates, is a 83 year old female seen in request by [facility] at  [facility] Dr. Gretta, Lacinda Browning, for evaluation of bilateral hands tremor, drooling, gait abnormality, initial evaluation was on Mar 30, 2024  History is obtained from the patient and review of electronic medical records. I personally reviewed pertinent available imaging films in PACS.   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, has chronic constipation, she sleeps well, but excessive sleepiness fatigue during the day, taking frequent naps, denied visual hallucinations, has mild memory loss,  Her mother suffered dementia   UPDATE July 11 2024: She was found to have mild parkinsonian features, started on Sinemet  25/100 mg 3 times a day since last visit in May 2025, she did tolerate the medication well, noticed significant improvement in her gait, physical therapy was also helpful  Personally reviewed MRI of the brain from June 2025, mild generalized atrophy, moderate small vessel disease  She has a history of aortic valve replacement for aortic stenosis in October 2022, under the care of cardiologist Dr. Wadie Louder echocardiogram September 2024 showed ejection fraction 50 to 55%, replaced aortic valve, no evidence of regurgitation, left atrium size was mildly dilated, she is on aspirin  81 mg daily  PHYSICAL EXAM:   Vitals:   07/11/24 0953  BP: 134/79  Pulse: 66  Resp: 15  SpO2: 95%  Height: 5' 1 (1.549 m)     Body mass index is 38.55 kg/m.  PHYSICAL EXAMNIATION:  Gen: NAD, conversant, well nourised, well groomed                     Cardiovascular: Regular rate rhythm, no peripheral edema, warm, nontender.  Eyes: Conjunctivae clear without exudates or hemorrhage Neck: Supple, no carotid bruits. Pulmonary: Clear to auscultation bilaterally   NEUROLOGICAL EXAM:  MENTAL STATUS: Speech/cognition: Obese, slow reaction time,  oriented to history taking and casual conversation CRANIAL NERVES: CN II: Visual fields are full to confrontation.  Pupils are round equal and briskly reactive to light. CN III, IV, VI: extraocular movement are normal. No ptosis. CN V: Facial sensation is intact to light touch CN VII: Face is symmetric with normal eye closure  CN VIII: Hearing is normal to causal conversation. CN IX, X: Phonation is normal. CN XI: Head turning and shoulder shrug are intact  MOTOR: There was no resting tremor noted today, only slight left more than right rigidity, bradykinesia  REFLEXES: Reflexes are 2  and symmetric at the biceps, triceps, trace knees, and absent ankles. Plantar responses are flexor.  SENSORY: Length-dependent decreased light touch, pinprick, vibratory sensation to distal shin level  COORDINATION: There is no trunk or limb dysmetria noted.  GAIT/STANCE: Push-up to get up from seated position, slightly decreased bilateral arm swing, leaning forward, en bloc turning  REVIEW OF SYSTEMS:  Full 14 system review of systems performed and notable only for as above All other review of systems were negative.   ALLERGIES: No Known Allergies  HOME MEDICATIONS: Current Outpatient Medications  Medication Sig Dispense Refill   ALPRAZolam  (XANAX ) 0.5 MG tablet Take 1-2 tablets 30 minutes prior to MRI, may repeat once as needed. Must have driver. 3 tablet 0   amoxicillin  (AMOXIL ) 500 MG capsule TAKE 4 CAPSULES BY MOUTH 1 HOUR PRIOR TO DENTAL PROCEDURES AND CLEANINGS 4 capsule 0   Ascorbic Acid (VITAMIN C PO) Take 500 mg by mouth daily.     aspirin  81 MG chewable tablet Chew 1 tablet (81 mg total) by mouth daily.     calcium carbonate (OS-CAL) 1250 (500 Ca) MG chewable tablet Chew 1 tablet by mouth daily.     carbidopa -levodopa  (SINEMET  IR) 25-100 MG tablet Take 1 tablet by mouth 3 (three) times daily. 90 tablet 11   Cholecalciferol (VITAMIN D3) 125 MCG (5000 UT) CAPS Take 5,000 Units by mouth daily.     fesoterodine (TOVIAZ) 4 MG TB24 tablet Take 8 mg by mouth daily.     fluticasone (FLONASE) 50 MCG/ACT  nasal spray Place 1 spray into both nostrils daily.     gabapentin (NEURONTIN) 300 MG capsule Take 900 mg by mouth at bedtime. Takes 300 mg in the am and 600mg  in the pm (Patient taking differently: Take 600 mg by mouth at bedtime. Takes 300 mg in the am and 600mg  in the pm)     levothyroxine  (SYNTHROID ) 100 MCG tablet Take 100 mcg by mouth daily before breakfast.     lisinopril (ZESTRIL) 10 MG tablet Take 10 mg by mouth daily.     Magnesium  400 MG TABS Take 400 mg by mouth daily.     mirabegron ER (MYRBETRIQ) 50 MG TB24 tablet Take 50 mg by mouth daily.     Multiple Vitamins-Minerals (ZINC PO) Take 50 mg by mouth daily.     niacin 500 MG tablet Take 500 mg by mouth daily.     pantoprazole  (PROTONIX ) 40 MG tablet Take 40 mg by mouth 2 (two) times daily. (Patient taking differently: Take 40 mg by mouth daily.)     polyvinyl alcohol (LIQUIFILM TEARS) 1.4 % ophthalmic solution Place 1 drop into both eyes daily as needed for dry eyes.     POTASSIUM PO Take 595  mg by mouth daily.     No current facility-administered medications for this visit.    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 Verlin, Dr. Wendel, Dr. Lucas   Severe aortic stenosis    Sleep apnea     PAST SURGICAL HISTORY: Past Surgical History:  Procedure Laterality Date   ABDOMINAL HYSTERECTOMY     CHOLECYSTECTOMY     RIGHT/LEFT HEART CATH AND CORONARY ANGIOGRAPHY N/A 06/09/2021   Procedure: RIGHT/LEFT HEART CATH AND CORONARY ANGIOGRAPHY;  Surgeon: Court Dorn PARAS, MD;  Location: [facility] INVASIVE CV LAB;  Service: Cardiovascular;  Laterality: N/A;   TEE WITHOUT CARDIOVERSION N/A 09/16/2021   Procedure: TRANSESOPHAGEAL ECHOCARDIOGRAM (TEE);  Surgeon: Verlin Lonni BIRCH, MD;  Location: [facility] INVASIVE CV  LAB;  Service: Open Heart Surgery;  Laterality: N/A;   TRANSCATHETER AORTIC VALVE REPLACEMENT, TRANSFEMORAL N/A 09/16/2021   Procedure: TRANSCATHETER AORTIC VALVE REPLACEMENT, TRANSFEMORAL;  Surgeon: Verlin Lonni BIRCH, MD;  Location: [facility] INVASIVE CV LAB;  Service: Open Heart Surgery;  Laterality: N/A;    FAMILY HISTORY: Family History  Problem Relation Age of Onset   Cancer Father    Diabetes Brother     SOCIAL HISTORY: Social History   Socioeconomic History   Marital status: Widowed    Spouse name: Not on file   Number of children: 6   Years of education: Not on file   Highest education level: Not on file  Occupational History   Occupation: Futures trader, drove a school bus, Engineer, petroleum  Tobacco Use   Smoking status: Never   Smokeless tobacco: Never  Vaping Use   Vaping status: Never Used  Substance and Sexual Activity   Alcohol use: Never   Drug use: Never   Sexual activity: Not on file  Other Topics Concern   Not on file  Social History Narrative   Right handed   Caffeine  2 cups daily   Lives alone   Retired, cafeteria lady      Social Drivers of Corporate investment banker Strain: Not on File (11/10/2022)   Received from [employer]    Financial Resource Strain: 0  Food Insecurity: Low Risk  (04/20/2024)   Received from [facility]   Hunger Vital Sign    Within the past 12 months, you worried that your food would run out before you got money to buy more: Never true    Within the past 12 months, the food you bought just didn't last and you didn't have money to get more. : Never true  Transportation Needs: No Transportation Needs (04/20/2024)   Received from [facility]    In the past 12 months, has lack of reliable transportation kept you from medical appointments, meetings, work or from getting things needed for daily living? : No  Physical Activity: Not on File (11/10/2022)   Received from [facility]   Physical  Activity    Physical Activity: 0  Stress: Not on File (11/10/2022)   Received from [facility]   Stress    Stress: 0  Social Connections: Not on File (11/10/2022)   Received from [employer]   Social Connections    Connectedness: 0  Intimate Partner Violence: Not on file  Trung Wenzl, M.D. Ph.D.  [facility] [address] [place], [place] [address] Ph: [phone] Fax: [phone]  CC:  Gretta Lacinda Browning, MD [address]  [address] [place],  [place] [address]  Puglisi, Santa SQUIBB, FNP

## 2024-07-14 ENCOUNTER — Ambulatory Visit (HOSPITAL_COMMUNITY)
Admission: RE | Admit: 2024-07-14 | Discharge: 2024-07-14 | Disposition: A | Discharge status: Home / Self Care | Source: Ambulatory Visit | Attending: Neurology | Admitting: Neurology

## 2024-07-14 DIAGNOSIS — I679 Cerebrovascular disease, unspecified: Secondary | ICD-10-CM | POA: Diagnosis present

## 2024-07-14 DIAGNOSIS — R269 Unspecified abnormalities of gait and mobility: Secondary | ICD-10-CM | POA: Insufficient documentation

## 2024-07-14 DIAGNOSIS — G20C Parkinsonism, unspecified: Secondary | ICD-10-CM | POA: Diagnosis present

## 2024-07-18 ENCOUNTER — Ambulatory Visit: Payer: Self-pay | Admitting: Neurology

## 2025-05-14 ENCOUNTER — Ambulatory Visit: Admitting: Adult Health
